# Patient Record
Sex: Male | Born: 1940 | Race: White | Hispanic: No | Marital: Single | Smoking: Former smoker
Health system: Southern US, Community
[De-identification: ages and names within clinical notes are randomized; demographics above are authoritative.]

## PROBLEM LIST (undated history)

## (undated) DIAGNOSIS — I77811 Abdominal aortic ectasia: Secondary | ICD-10-CM

## (undated) DIAGNOSIS — D49511 Neoplasm of unspecified behavior of right kidney: Secondary | ICD-10-CM

## (undated) DIAGNOSIS — I10 Essential (primary) hypertension: Secondary | ICD-10-CM

## (undated) DIAGNOSIS — N400 Enlarged prostate without lower urinary tract symptoms: Secondary | ICD-10-CM

## (undated) DIAGNOSIS — I509 Heart failure, unspecified: Secondary | ICD-10-CM

## (undated) DIAGNOSIS — D649 Anemia, unspecified: Secondary | ICD-10-CM

## (undated) DIAGNOSIS — K8020 Calculus of gallbladder without cholecystitis without obstruction: Secondary | ICD-10-CM

## (undated) DIAGNOSIS — D696 Thrombocytopenia, unspecified: Secondary | ICD-10-CM

## (undated) DIAGNOSIS — K5792 Diverticulitis of intestine, part unspecified, without perforation or abscess without bleeding: Secondary | ICD-10-CM

## (undated) DIAGNOSIS — J189 Pneumonia, unspecified organism: Secondary | ICD-10-CM

## (undated) DIAGNOSIS — C801 Malignant (primary) neoplasm, unspecified: Secondary | ICD-10-CM

## (undated) DIAGNOSIS — R41841 Cognitive communication deficit: Secondary | ICD-10-CM

## (undated) DIAGNOSIS — I499 Cardiac arrhythmia, unspecified: Secondary | ICD-10-CM

## (undated) DIAGNOSIS — I4891 Unspecified atrial fibrillation: Secondary | ICD-10-CM

## (undated) DIAGNOSIS — I700 Atherosclerosis of aorta: Secondary | ICD-10-CM

## (undated) DIAGNOSIS — N189 Chronic kidney disease, unspecified: Secondary | ICD-10-CM

## (undated) DIAGNOSIS — E559 Vitamin D deficiency, unspecified: Secondary | ICD-10-CM

## (undated) DIAGNOSIS — I714 Abdominal aortic aneurysm, without rupture, unspecified: Secondary | ICD-10-CM

## (undated) DIAGNOSIS — E669 Obesity, unspecified: Secondary | ICD-10-CM

## (undated) HISTORY — DX: Abdominal aortic ectasia: I77.811

## (undated) HISTORY — DX: Abdominal aortic aneurysm, without rupture, unspecified: I71.40

## (undated) HISTORY — PX: CATARACT EXTRACTION: SUR2

## (undated) HISTORY — DX: Obesity, unspecified: E66.9

## (undated) HISTORY — DX: Thrombocytopenia, unspecified: D69.6

## (undated) HISTORY — DX: Essential (primary) hypertension: I10

## (undated) HISTORY — DX: Anemia, unspecified: D64.9

## (undated) HISTORY — DX: Benign prostatic hyperplasia without lower urinary tract symptoms: N40.0

## (undated) HISTORY — DX: Vitamin D deficiency, unspecified: E55.9

## (undated) HISTORY — DX: Heart failure, unspecified: I50.9

## (undated) HISTORY — DX: Chronic kidney disease, unspecified: N18.9

## (undated) HISTORY — DX: Calculus of gallbladder without cholecystitis without obstruction: K80.20

## (undated) HISTORY — DX: Diverticulitis of intestine, part unspecified, without perforation or abscess without bleeding: K57.92

## (undated) HISTORY — DX: Cognitive communication deficit: R41.841

## (undated) HISTORY — DX: Atherosclerosis of aorta: I70.0

## (undated) HISTORY — DX: Neoplasm of unspecified behavior of right kidney: D49.511

## (undated) HISTORY — DX: Unspecified atrial fibrillation: I48.91

## (undated) HISTORY — DX: Abdominal aortic aneurysm, without rupture: I71.4

---

## 2006-10-28 ENCOUNTER — Ambulatory Visit: Payer: Self-pay | Admitting: Cardiology

## 2006-11-15 ENCOUNTER — Ambulatory Visit: Payer: Self-pay | Admitting: Cardiology

## 2006-11-22 ENCOUNTER — Ambulatory Visit: Payer: Self-pay | Admitting: Cardiology

## 2006-11-29 ENCOUNTER — Ambulatory Visit: Payer: Self-pay | Admitting: Cardiology

## 2007-01-16 ENCOUNTER — Ambulatory Visit: Payer: Self-pay | Admitting: Cardiology

## 2015-09-19 ENCOUNTER — Telehealth: Payer: Self-pay | Admitting: Internal Medicine

## 2017-01-18 ENCOUNTER — Ambulatory Visit (INDEPENDENT_AMBULATORY_CARE_PROVIDER_SITE_OTHER): Payer: Medicare Other | Admitting: Urology

## 2017-01-18 DIAGNOSIS — R310 Gross hematuria: Secondary | ICD-10-CM

## 2017-01-18 DIAGNOSIS — C641 Malignant neoplasm of right kidney, except renal pelvis: Secondary | ICD-10-CM

## 2017-02-01 ENCOUNTER — Ambulatory Visit (INDEPENDENT_AMBULATORY_CARE_PROVIDER_SITE_OTHER): Payer: Medicare Other | Admitting: Urology

## 2017-02-01 DIAGNOSIS — C641 Malignant neoplasm of right kidney, except renal pelvis: Secondary | ICD-10-CM

## 2017-03-03 ENCOUNTER — Other Ambulatory Visit (HOSPITAL_COMMUNITY): Payer: Self-pay | Admitting: Internal Medicine

## 2017-03-03 ENCOUNTER — Ambulatory Visit (HOSPITAL_COMMUNITY): Payer: Medicare Other

## 2017-03-03 DIAGNOSIS — R443 Hallucinations, unspecified: Secondary | ICD-10-CM

## 2017-03-03 DIAGNOSIS — R410 Disorientation, unspecified: Secondary | ICD-10-CM

## 2017-03-16 ENCOUNTER — Ambulatory Visit (HOSPITAL_COMMUNITY)
Admission: RE | Admit: 2017-03-16 | Discharge: 2017-03-16 | Disposition: A | Discharge status: Home / Self Care | Payer: Medicare Other | Source: Ambulatory Visit | Attending: Internal Medicine | Admitting: Internal Medicine

## 2017-03-16 DIAGNOSIS — R443 Hallucinations, unspecified: Secondary | ICD-10-CM | POA: Diagnosis not present

## 2017-03-16 DIAGNOSIS — G319 Degenerative disease of nervous system, unspecified: Secondary | ICD-10-CM | POA: Diagnosis not present

## 2017-03-16 DIAGNOSIS — R410 Disorientation, unspecified: Secondary | ICD-10-CM

## 2017-05-04 ENCOUNTER — Encounter (HOSPITAL_COMMUNITY): Payer: Self-pay

## 2017-05-04 ENCOUNTER — Encounter (HOSPITAL_COMMUNITY): Payer: Medicare Other

## 2017-05-04 ENCOUNTER — Encounter (HOSPITAL_COMMUNITY): Payer: Medicare Other | Attending: Oncology | Admitting: Oncology

## 2017-05-04 DIAGNOSIS — N2889 Other specified disorders of kidney and ureter: Secondary | ICD-10-CM | POA: Insufficient documentation

## 2017-05-04 LAB — LACTATE DEHYDROGENASE: LDH: 134 U/L (ref 98–192)

## 2017-05-04 LAB — CBC WITH DIFFERENTIAL/PLATELET
Basophils Absolute: 0 10*3/uL (ref 0.0–0.1)
Basophils Relative: 1 %
EOS ABS: 0.3 10*3/uL (ref 0.0–0.7)
EOS PCT: 5 %
HCT: 36.6 % — ABNORMAL LOW (ref 39.0–52.0)
Hemoglobin: 12 g/dL — ABNORMAL LOW (ref 13.0–17.0)
LYMPHS ABS: 1 10*3/uL (ref 0.7–4.0)
Lymphocytes Relative: 18 %
MCH: 31.3 pg (ref 26.0–34.0)
MCHC: 32.8 g/dL (ref 30.0–36.0)
MCV: 95.3 fL (ref 78.0–100.0)
MONO ABS: 0.5 10*3/uL (ref 0.1–1.0)
Monocytes Relative: 8 %
Neutro Abs: 3.9 10*3/uL (ref 1.7–7.7)
Neutrophils Relative %: 68 %
PLATELETS: 190 10*3/uL (ref 150–400)
RBC: 3.84 MIL/uL — AB (ref 4.22–5.81)
RDW: 15 % (ref 11.5–15.5)
WBC: 5.7 10*3/uL (ref 4.0–10.5)

## 2017-05-04 LAB — COMPREHENSIVE METABOLIC PANEL
ALT: 13 U/L — AB (ref 17–63)
ANION GAP: 6 (ref 5–15)
AST: 14 U/L — ABNORMAL LOW (ref 15–41)
Albumin: 3.1 g/dL — ABNORMAL LOW (ref 3.5–5.0)
Alkaline Phosphatase: 68 U/L (ref 38–126)
BUN: 22 mg/dL — ABNORMAL HIGH (ref 6–20)
CHLORIDE: 106 mmol/L (ref 101–111)
CO2: 26 mmol/L (ref 22–32)
CREATININE: 1.28 mg/dL — AB (ref 0.61–1.24)
Calcium: 8.8 mg/dL — ABNORMAL LOW (ref 8.9–10.3)
GFR calc non Af Amer: 53 mL/min — ABNORMAL LOW (ref >60)
Glucose, Bld: 110 mg/dL — ABNORMAL HIGH (ref 65–99)
POTASSIUM: 4.4 mmol/L (ref 3.5–5.1)
SODIUM: 138 mmol/L (ref 135–145)
Total Bilirubin: 0.5 mg/dL (ref 0.3–1.2)
Total Protein: 6.7 g/dL (ref 6.5–8.1)

## 2017-05-04 NOTE — Progress Notes (Signed)
[facility] Initial Visit:  Patient Care Team: Hilbert Corrigan, MD as PCP - General (Internal Medicine)  CHIEF COMPLAINTS/PURPOSE OF CONSULTATION:  Right renal mass  HISTORY OF PRESENTING ILLNESS: Vincent Pope 76 y.o. male is here because of  right renal mass suspicious for renal cell carcinoma. He is currently staying at [facility] and rehabilitation.  His most recent scans demonstrate:  CT abdomen and pelvis with contrast 01/09/17 demonstrated a 8.6 x 6.4 cm complex solid/cystic mass within the superior pole of the right kidney, previously 6.7 x 5.2 cm. Smaller bilateral low attenuation renal lesions are stable when compared to prior exam. Cysts within the left hepatic lobe is increased in size measuring up to 7.4 cm, previously 5.5 cm.  Chest x-ray 01/09/17 demonstrated a low lung volumes with basilar opacities primary and to represent atelectasis.  MRI abdomen with and without contrast 01/10/17 which demonstrated a heterogeneously enhancing mass in the right kidney, 6.2 x 8.7 x 6 cm, the lesion extends up to the renal vein but does not appear to extend into the renal vein at this time. This lesion remains in capsulated with [name], no lymphadenopathy. He also has multiple small renal cysts as well as numerous liver cysts as well.  Patient had gone for surgical evaluation by [name] back on 02/01/17 and was deemed not to be a surgical candidate due to his weakened state. Patient had just arrived at the skilled nursing facility for rehabilitation in February 2018. At that time he could not stand up or get out of his wheelchair and spent most of his time in bed. Per [name] know at that time, patient had abdominal CT dating back to April 2011 which revealed a 5.4 cm indeterminate cystic focus. Follow-up CT scan in October 2015 revealed a complex cystic mass measuring 6.5 x 5.2 x 4.5 cm in the right upper pole. 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  Currently patient states that his performance status is a lot better. With physical therapy he is now ambulatory. He denies any recent hematuria. He denies any chest pain, shortness breath, abdominal pain, focal weakness. He denies any weight loss, appetite is good.  Review of Systems  Constitutional: Negative for appetite change, chills, fatigue and fever.  HENT:   Negative for hearing loss, lump/mass, mouth sores, sore throat and tinnitus.   Eyes: Negative for eye problems and icterus.  Respiratory: Negative for chest tightness, cough, hemoptysis, shortness of breath and wheezing.   Cardiovascular: Negative for chest pain, leg swelling and palpitations.  Gastrointestinal: Negative for abdominal distention, abdominal pain, blood in stool, diarrhea, nausea and vomiting.  Endocrine: Negative.  Negative for hot flashes.  Genitourinary: Negative for difficulty urinating, frequency and hematuria.   Musculoskeletal: Negative for arthralgias and neck pain.  Skin: Negative for itching and rash.  Neurological: Negative for dizziness, headaches and speech difficulty.  Hematological: Negative for adenopathy. Does not bruise/bleed easily.  Psychiatric/Behavioral: Negative for confusion. The patient is not nervous/anxious.     MEDICAL HISTORY: Past Medical History:  Diagnosis Date  . 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 Atherosclerosis of aorta ([place])   . Atrial fibrillation ([place])   . Benign prostatic hyperplasia   . Cognitive communication deficit   . Congestive heart failure (CHF) ([place])   . Diverticulitis   . Hypertension, essential   . Neoplasm of right kidney   . Thrombocytopenia ([place])   . Vitamin D deficiency  SURGICAL HISTORY: Past Surgical History:  Procedure Laterality Date  . CATARACT EXTRACTION Bilateral     SOCIAL HISTORY: Social History   Social History  . Marital status: Married    Spouse name: N/A  . Number of children: N/A  .  Years of education: N/A   Occupational History  . Not on file.   Social History Main Topics  . Smoking status: Former Research scientist (life sciences)  . Smokeless tobacco: Never Used  . Alcohol use No  . Drug use: No  . Sexual activity: Not Currently   Other Topics Concern  . Not on file   Social History Narrative  . No narrative on file    FAMILY HISTORY History reviewed. No pertinent family history.  ALLERGIES:  has no allergies on file.  MEDICATIONS:  Current Outpatient Prescriptions  Medication Sig Dispense Refill  . Amino Acids-Protein Hydrolys (FEEDING SUPPLEMENT, PRO-STAT SUGAR FREE 64,) LIQD Take 30 mLs by mouth 3 (three) times daily with meals.    Marland Kitchen aspirin 81 MG chewable tablet Chew 81 mg by mouth daily.    . Cholecalciferol (VITAMIN D3) 1000 units CAPS Take 1 capsule by mouth daily.    . furosemide (LASIX) 40 MG tablet Take 40 mg by mouth daily.    Marland Kitchen lisinopril (PRINIVIL,ZESTRIL) 20 MG tablet Take 20 mg by mouth daily.    . metoprolol tartrate (LOPRESSOR) 25 MG tablet Take 25 mg by mouth 2 (two) times daily.    . multivitamin with minerals (CERTA-VITE) LIQD Take 5 mLs by mouth daily.    . pantoprazole (PROTONIX) 40 MG tablet Take 40 mg by mouth daily.    . polyethylene glycol (MIRALAX / GLYCOLAX) packet Take 17 g by mouth daily.    . tamsulosin (FLOMAX) 0.4 MG CAPS capsule Take 0.4 mg by mouth daily.    . traMADol (ULTRAM) 50 MG tablet Take 50 mg by mouth every 6 (six) hours as needed.     No current facility-administered medications for this visit.     PHYSICAL EXAMINATION:  ECOG PERFORMANCE STATUS: 1 - Symptomatic but completely ambulatory   Vitals:   05/04/17 1255  BP: (!) 121/58  Pulse: 79  Resp: 20  Temp: 97.9 F (36.6 C)    Filed Weights   05/04/17 1255  Weight: 277 lb 3.2 oz (125.7 kg)     Physical Exam  Constitutional: He is oriented to person, place, and time and well-developed, well-nourished, and in no distress. No distress.  HENT:  Head: Normocephalic  and atraumatic.  Mouth/Throat: No oropharyngeal exudate.  Eyes: Conjunctivae are normal. Pupils are equal, round, and reactive to light. No scleral icterus.  Neck: Normal range of motion. Neck supple. No JVD present.  Cardiovascular: Normal rate and normal heart sounds.  Exam reveals no gallop and no friction rub.   No murmur heard. Irregularly irregular rhythm  Pulmonary/Chest: Breath sounds normal. No respiratory distress. He has no wheezes. He has no rales.  Abdominal: Soft. Bowel sounds are normal. He exhibits no distension. There is no tenderness. There is no guarding.  Musculoskeletal: He exhibits edema. He exhibits no tenderness.  Lipoma on right side of mid back  Lymphadenopathy:    He has no cervical adenopathy.  Neurological: He is alert and oriented to person, place, and time. No cranial nerve deficit.  Skin: Skin is warm and dry. No rash noted. No erythema. No pallor.  Psychiatric: Affect and judgment normal.     LABORATORY DATA: I have personally reviewed the data as listed:  No  visits with results within 1 Month(s) from this visit.  Latest known visit with results is:  No results found for any previous visit.    RADIOGRAPHIC STUDIES: I have personally reviewed the radiological images as listed and agree with the findings in the report  No results found.  ASSESSMENT/PLAN 76 year old man with complex cystic right renal mass in the upper pole, suspicious for renal cell carcinoma. Based on his last MRI scans he is a clinical stage II (T2a N0 Mx)  PLAN: -I will plan to get stat restaging CT chest/abdomen/pelvis with contrast with patient since it is about 4 months since his last scans. If he continues to have disease limited to the right kidney, I will send him back for reevaluation by Dr.Dahlstedt since his performance status has improved tremendously with physical therapy. I discussed with the patient that his best chance for cure is for surgical resection of the  mass. -I will also check a CBC, CMP, LDH in order to calculate his [facility] prognostic score. -If patient should have metastatic disease, then I will plan to proceed with biopsy of the renal mass to determine whether it is clear cell or non-clear cell histology, which I will need to know in order to plan which treatment he will receive.  -RTC in 10 days for follow up and to review scans and to discuss the next plan of care.    Orders Placed This Encounter  Procedures  . CT Abdomen Pelvis W Contrast    Standing Status:   Future    Standing Expiration Date:   05/04/2018    Order Specific Question:   If indicated for the ordered procedure, I authorize the administration of contrast media per Radiology protocol    Answer:   Yes    Order Specific Question:   Reason for Exam (SYMPTOM  OR DIAGNOSIS REQUIRED)    Answer:   staging scans for RCC    Order Specific Question:   Preferred imaging location?    Answer:   [facility]    Order Specific Question:   Radiology Contrast Protocol - do NOT remove file path    Answer:   \\[email or web address].pdf  . CT Chest W Contrast    Standing Status:   Future    Standing Expiration Date:   05/04/2018    Order Specific Question:   If indicated for the ordered procedure, I authorize the administration of contrast media per Radiology protocol    Answer:   Yes    Order Specific Question:   Reason for Exam (SYMPTOM  OR DIAGNOSIS REQUIRED)    Answer:   staging scans for RCC    Order Specific Question:   Preferred imaging location?    Answer:   [facility]    Order Specific Question:   Radiology Contrast Protocol - do NOT remove file path    Answer:   \\[email or web address].pdf  . CBC with Differential    Standing Status:   Future    Standing Expiration Date:   05/04/2018  . Comprehensive metabolic panel    Standing Status:   Future    Standing Expiration Date:   05/04/2018  . Lactate dehydrogenase    Standing  Status:   Future    Standing Expiration Date:   05/04/2018    All questions were answered. The patient knows to call the clinic with any problems, questions or concerns.  This note was electronically signed.    Twana First, MD  05/04/2017 1:15 PM

## 2017-05-04 NOTE — Patient Instructions (Signed)
[facility] at [facility] Discharge Instructions  RECOMMENDATIONS MADE BY THE CONSULTANT AND ANY TEST RESULTS WILL BE SENT TO YOUR REFERRING PHYSICIAN.  You were seen today by Dr. Louise Zhou    Thank you for choosing [facility] at [facility] to provide your oncology and hematology care.  To afford each patient quality time with our provider, please arrive at least 15 minutes before your scheduled appointment time.    If you have a lab appointment with the Cancer Center please come in thru the  Main Entrance and check in at the main information desk  You need to re-schedule your appointment should you arrive 10 or more minutes late.  We strive to give you quality time with our providers, and arriving late affects you and other patients whose appointments are after yours.  Also, if you no show three or more times for appointments you may be dismissed from the clinic at the providers discretion.     Again, thank you for choosing [facility].  Our hope is that these requests will decrease the amount of time that you wait before being seen by our physicians.       _____________________________________________________________  Should you have questions after your visit to [facility], please contact our office at [phone] between the hours of 8:30 a.m. and 4:30 p.m.  Voicemails left after 4:30 p.m. will not be returned until the following business day.  For prescription refill requests, have your pharmacy contact our office.       Resources For Cancer Patients and their Caregivers ? American Cancer Society: Can assist with transportation, wigs, general needs, runs Look Good Feel Better.        1-888-227-6333 ? Cancer Care: Provides financial assistance, online support groups, medication/co-pay assistance.  1-800-813-HOPE (4673) ? [facility] Assists [place] cancer patients and their  families through emotional , educational and financial support.  336-427-4357 ? [place] DSS Where to apply for food stamps, Medicaid and utility assistance. 336-342-1394 ? [facility]: Transportation to medical appointments. 336-347-2287 ? Social Security Administration: May apply for disability if have a Stage IV cancer. 336-342-7796 1-800-772-1213 ? [place] Aging, Disability and Transit Services: Assists with nutrition, care and transit needs. 336-349-2343  Cancer Center Support Programs: @10RELATIVEDAYS@ > Cancer Support Group  2nd Tuesday of the month 1pm-2pm, Journey Room  > Creative Journey  3rd Tuesday of the month 1130am-1pm, Journey Room  > Look Good Feel Better  1st Wednesday of the month 10am-12 noon, Journey Room (Call American Cancer Society to register 1-800-395-5775)    

## 2017-05-05 ENCOUNTER — Encounter (HOSPITAL_COMMUNITY): Payer: Self-pay

## 2017-05-05 ENCOUNTER — Ambulatory Visit (HOSPITAL_COMMUNITY)
Admission: RE | Admit: 2017-05-05 | Discharge: 2017-05-05 | Disposition: A | Discharge status: Home / Self Care | Payer: Medicare Other | Source: Ambulatory Visit | Attending: Oncology | Admitting: Oncology

## 2017-05-05 DIAGNOSIS — I714 Abdominal aortic aneurysm, without rupture: Secondary | ICD-10-CM | POA: Insufficient documentation

## 2017-05-05 DIAGNOSIS — I700 Atherosclerosis of aorta: Secondary | ICD-10-CM | POA: Diagnosis not present

## 2017-05-05 DIAGNOSIS — E041 Nontoxic single thyroid nodule: Secondary | ICD-10-CM | POA: Diagnosis not present

## 2017-05-05 DIAGNOSIS — K5730 Diverticulosis of large intestine without perforation or abscess without bleeding: Secondary | ICD-10-CM | POA: Insufficient documentation

## 2017-05-05 DIAGNOSIS — N2889 Other specified disorders of kidney and ureter: Secondary | ICD-10-CM | POA: Insufficient documentation

## 2017-05-05 DIAGNOSIS — K8020 Calculus of gallbladder without cholecystitis without obstruction: Secondary | ICD-10-CM | POA: Insufficient documentation

## 2017-05-05 HISTORY — DX: Malignant (primary) neoplasm, unspecified: C80.1

## 2017-05-05 MED ORDER — IOPAMIDOL (ISOVUE-300) INJECTION 61%
100.0000 mL | Freq: Once | INTRAVENOUS | Status: AC | PRN
  Administered 2017-05-05: 100 mL via INTRAVENOUS

## 2017-05-13 ENCOUNTER — Encounter (HOSPITAL_COMMUNITY): Payer: Self-pay | Admitting: Lab

## 2017-05-13 ENCOUNTER — Encounter (HOSPITAL_COMMUNITY): Payer: Self-pay

## 2017-05-13 ENCOUNTER — Encounter (HOSPITAL_COMMUNITY): Payer: Medicare Other | Attending: Oncology | Admitting: Oncology

## 2017-05-13 VITALS — BP 98/53 | HR 59 | Temp 97.8°F | Resp 20 | Wt 284.7 lb

## 2017-05-13 DIAGNOSIS — N2889 Other specified disorders of kidney and ureter: Secondary | ICD-10-CM

## 2017-05-13 DIAGNOSIS — I714 Abdominal aortic aneurysm, without rupture: Secondary | ICD-10-CM

## 2017-05-13 DIAGNOSIS — E041 Nontoxic single thyroid nodule: Secondary | ICD-10-CM | POA: Diagnosis not present

## 2017-05-13 NOTE — Patient Instructions (Addendum)
[facility] at [facility] Discharge Instructions  RECOMMENDATIONS MADE BY THE CONSULTANT AND ANY TEST RESULTS WILL BE SENT TO YOUR REFERRING PHYSICIAN.  You were seen today by Dr. Twana First Follow up in 4 weeks or sooner depending on what [name] recommends for treatment.   Thank you for choosing [place] at [facility] to provide your oncology and hematology care.  To afford each patient quality time with our provider, please arrive at least 15 minutes before your scheduled appointment time.    If you have a lab appointment with the [facility] please come in thru the  Main Entrance and check in at the main information desk  You need to re-schedule your appointment should you arrive 10 or more minutes late.  We strive to give you quality time with our providers, and arriving late affects you and other patients whose appointments are after yours.  Also, if you no show three or more times for appointments you may be dismissed from the clinic at the providers discretion.     Again, thank you for choosing [facility].  Our hope is that these requests will decrease the amount of time that you wait before being seen by our physicians.       _____________________________________________________________  Should you have questions after your visit to [facility], please contact our office at [phone] between the hours of 8:30 a.m. and 4:30 p.m.  Voicemails left after 4:30 p.m. will not be returned until the following business day.  For prescription refill requests, have your pharmacy contact our office.       Resources For Cancer Patients and their Caregivers ? American Cancer Society: Can assist with transportation, wigs, general needs, runs Look Good Feel Better.        757-302-4813 ? Cancer Care: Provides financial assistance, online support groups, medication/co-pay assistance.  1-800-813-HOPE  (430)609-8600) ? [facility] Assists [place] cancer patients and their families through emotional , educational and financial support.  302-305-4931 ? [place] DSS Where to apply for food stamps, Medicaid and utility assistance. 586 016 3833 ? RCATS: Transportation to medical appointments. 478-259-1033 ? Social Security Administration: May apply for disability if have a Stage IV cancer. 947-341-9643 252-686-8393 ? [employer], Disability and Transit Services: Assists with nutrition, care and transit needs. [place] Support Programs: @10RELATIVEDAYS @ > Cancer Support Group  2nd Tuesday of the month 1pm-2pm, Journey Room  > Creative Journey  3rd Tuesday of the month 1130am-1pm, Journey Room  > Look Good Feel Better  1st Wednesday of the month 10am-12 noon, Journey Room (Call [place] to register [phone])

## 2017-05-13 NOTE — Progress Notes (Unsigned)
Referral sent to [facility] (Refer back to [name] for evaluation of resection of right kidney mass).  Records faxed on 6/1.  Office to review and will determine about appt,  They will call [facility].

## 2017-05-13 NOTE — Progress Notes (Signed)
[place] [facility] Initial Visit:  Patient Care Team: Hilbert Corrigan, MD as PCP - General (Internal Medicine)  CHIEF COMPLAINTS/PURPOSE OF CONSULTATION:  Right renal mass  HISTORY OF PRESENTING ILLNESS: Vincent Pope 76 y.o. male is here because of  right renal mass suspicious for renal cell carcinoma. He is currently staying at [facility] and rehabilitation.  His most recent scans demonstrate:  CT abdomen and pelvis with contrast 01/09/17 demonstrated a 8.6 x 6.4 cm complex solid/cystic mass within the superior pole of the right kidney, previously 6.7 x 5.2 cm. Smaller bilateral low attenuation renal lesions are stable when compared to prior exam. Cysts within the left hepatic lobe is increased in size measuring up to 7.4 cm, previously 5.5 cm.  Chest x-ray 01/09/17 demonstrated a low lung volumes with basilar opacities primary and to represent atelectasis.  MRI abdomen with and without contrast 01/10/17 which demonstrated a heterogeneously enhancing mass in the right kidney, 6.2 x 8.7 x 6 cm, the lesion extends up to the renal vein but does not appear to extend into the renal vein at this time. This lesion remains in capsulated with [name], no lymphadenopathy. He also has multiple small renal cysts as well as numerous liver cysts as well.  Patient had gone for surgical evaluation by [name] back on 02/01/17 and was deemed not to be a surgical candidate due to his weakened state. Patient had just arrived at the skilled nursing facility for rehabilitation in February 2018. At that time he could not stand up or get out of his wheelchair and spent most of his time in bed. Per [name] know at that time, patient had abdominal CT dating back to April 2011 which revealed a 5.4 cm indeterminate cystic focus. Follow-up CT scan in October 2015 revealed a complex cystic mass measuring 6.5 x 5.2 x 4.5 cm in the right upper pole. 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  Currently patient states that his performance status is a lot better. With physical therapy he is now ambulatory. He denies any recent hematuria. He denies any chest pain, shortness breath, abdominal pain, focal weakness. He denies any weight loss, appetite is good.  INTERVAL HISTORY: Patient presents today for review of his CT scan results. He has no new complaints since his last visit.  CT C/A/P 05/05/17 demonstrated stable upper pole right renal mass with imaging features suspicious for renal cell carcinoma, measuring 8.8 x 6.3 cm in the axial plane today. No evidence of metastatic disease in the chest, abdomen or pelvis. LDH was WNL.    Review of Systems  Constitutional: Negative for appetite change, chills, fatigue and fever.  HENT:   Negative for hearing loss, lump/mass, mouth sores, sore throat and tinnitus.   Eyes: Negative for eye problems and icterus.  Respiratory: Negative for chest tightness, cough, hemoptysis, shortness of breath and wheezing.   Cardiovascular: Negative for chest pain, leg swelling and palpitations.  Gastrointestinal: Negative for abdominal distention, abdominal pain, blood in stool, diarrhea, nausea and vomiting.  Endocrine: Negative.  Negative for hot flashes.  Genitourinary: Negative for difficulty urinating, frequency and hematuria.   Musculoskeletal: Negative for arthralgias and neck pain.  Skin: Negative for itching and rash.  Neurological: Negative for dizziness, headaches and speech difficulty.  Hematological: Negative for adenopathy. Does not bruise/bleed easily.  Psychiatric/Behavioral: Negative for confusion. The patient is not nervous/anxious.     MEDICAL HISTORY: Past Medical History:  Diagnosis Date  .  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 Atherosclerosis of aorta ([place])   . Atrial fibrillation ([place])   . Benign prostatic hyperplasia   . Cancer ([place])   . Cognitive communication deficit   . Congestive  heart failure (CHF) ([place])   . Diverticulitis   . Hypertension, essential   . Neoplasm of right kidney   . Thrombocytopenia ([place])   . Vitamin D deficiency     SURGICAL HISTORY: Past Surgical History:  Procedure Laterality Date  . CATARACT EXTRACTION Bilateral     SOCIAL HISTORY: Social History   Social History  . Marital status: Married    Spouse name: N/A  . Number of children: N/A  . Years of education: N/A   Occupational History  . Not on file.   Social History Main Topics  . Smoking status: Former Research scientist (life sciences)  . Smokeless tobacco: Never Used  . Alcohol use No  . Drug use: No  . Sexual activity: Not Currently   Other Topics Concern  . Not on file   Social History Narrative  . No narrative on file    FAMILY HISTORY No family history on file.  ALLERGIES:  has no allergies on file.  MEDICATIONS:  Current Outpatient Prescriptions  Medication Sig Dispense Refill  . Amino Acids-Protein Hydrolys (FEEDING SUPPLEMENT, PRO-STAT SUGAR FREE 64,) LIQD Take 30 mLs by mouth 3 (three) times daily with meals.    Marland Kitchen aspirin 81 MG chewable tablet Chew 81 mg by mouth daily.    . Cholecalciferol (VITAMIN D3) 1000 units CAPS Take 1 capsule by mouth daily.    . furosemide (LASIX) 40 MG tablet Take 40 mg by mouth daily.    Marland Kitchen lisinopril (PRINIVIL,ZESTRIL) 20 MG tablet Take 20 mg by mouth daily.    . metoprolol tartrate (LOPRESSOR) 25 MG tablet Take 25 mg by mouth 2 (two) times daily.    . multivitamin with minerals (CERTA-VITE) LIQD Take 5 mLs by mouth daily.    . pantoprazole (PROTONIX) 40 MG tablet Take 40 mg by mouth daily.    . polyethylene glycol (MIRALAX / GLYCOLAX) packet Take 17 g by mouth daily.    . tamsulosin (FLOMAX) 0.4 MG CAPS capsule Take 0.4 mg by mouth daily.    . traMADol (ULTRAM) 50 MG tablet Take 50 mg by mouth every 6 (six) hours as needed.     No current facility-administered medications for this visit.     PHYSICAL EXAMINATION:  ECOG PERFORMANCE STATUS: 1 -  Symptomatic but completely ambulatory   Vitals:   05/13/17 1539  BP: (!) 98/53  Pulse: (!) 59  Resp: 20  Temp: 97.8 F (36.6 C)     Physical Exam  Constitutional: He is oriented to person, place, and time and well-developed, well-nourished, and in no distress. No distress.  HENT:  Head: Normocephalic and atraumatic.  Mouth/Throat: No oropharyngeal exudate.  Eyes: Conjunctivae are normal. Pupils are equal, round, and reactive to light. No scleral icterus.  Neck: Normal range of motion. Neck supple. No JVD present.  Cardiovascular: Normal rate and normal heart sounds.  Exam reveals no gallop and no friction rub.   No murmur heard. Irregularly irregular rhythm  Pulmonary/Chest: Breath sounds normal. No respiratory distress. He has no wheezes. He has no rales.  Abdominal: Soft. Bowel sounds are normal. He exhibits no distension. There is no tenderness. There is no guarding.  Musculoskeletal: He exhibits edema. He exhibits no tenderness.  Lipoma on right side of mid back  Lymphadenopathy:    He  has no cervical adenopathy.  Neurological: He is alert and oriented to person, place, and time. No cranial nerve deficit.  Skin: Skin is warm and dry. No rash noted. No erythema. No pallor.  Psychiatric: Affect and judgment normal.     LABORATORY DATA: I have personally reviewed the data as listed:  Appointment on 05/04/2017  Component Date Value Ref Range Status  . WBC 05/04/2017 5.7  4.0 - 10.5 K/uL Final  . RBC 05/04/2017 3.84* 4.22 - 5.81 MIL/uL Final  . Hemoglobin 05/04/2017 12.0* 13.0 - 17.0 g/dL Final  . HCT 05/04/2017 36.6* 39.0 - 52.0 % Final  . MCV 05/04/2017 95.3  78.0 - 100.0 fL Final  . MCH 05/04/2017 31.3  26.0 - 34.0 pg Final  . MCHC 05/04/2017 32.8  30.0 - 36.0 g/dL Final  . RDW 05/04/2017 15.0  11.5 - 15.5 % Final  . Platelets 05/04/2017 190  150 - 400 K/uL Final  . Neutrophils Relative % 05/04/2017 68  % Final  . Neutro Abs 05/04/2017 3.9  1.7 - 7.7 K/uL Final  .  Lymphocytes Relative 05/04/2017 18  % Final  . Lymphs Abs 05/04/2017 1.0  0.7 - 4.0 K/uL Final  . Monocytes Relative 05/04/2017 8  % Final  . Monocytes Absolute 05/04/2017 0.5  0.1 - 1.0 K/uL Final  . Eosinophils Relative 05/04/2017 5  % Final  . Eosinophils Absolute 05/04/2017 0.3  0.0 - 0.7 K/uL Final  . Basophils Relative 05/04/2017 1  % Final  . Basophils Absolute 05/04/2017 0.0  0.0 - 0.1 K/uL Final  . Sodium 05/04/2017 138  135 - 145 mmol/L Final  . Potassium 05/04/2017 4.4  3.5 - 5.1 mmol/L Final  . Chloride 05/04/2017 106  101 - 111 mmol/L Final  . CO2 05/04/2017 26  22 - 32 mmol/L Final  . Glucose, Bld 05/04/2017 110* 65 - 99 mg/dL Final  . BUN 05/04/2017 22* 6 - 20 mg/dL Final  . Creatinine, Ser 05/04/2017 1.28* 0.61 - 1.24 mg/dL Final  . Calcium 05/04/2017 8.8* 8.9 - 10.3 mg/dL Final  . Total Protein 05/04/2017 6.7  6.5 - 8.1 g/dL Final  . Albumin 05/04/2017 3.1* 3.5 - 5.0 g/dL Final  . AST 05/04/2017 14* 15 - 41 U/L Final  . ALT 05/04/2017 13* 17 - 63 U/L Final  . Alkaline Phosphatase 05/04/2017 68  38 - 126 U/L Final  . Total Bilirubin 05/04/2017 0.5  0.3 - 1.2 mg/dL Final  . GFR calc non Af Amer 05/04/2017 53* >60 mL/min Final  . GFR calc Af Amer 05/04/2017 >60  >60 mL/min Final   Comment: (NOTE) The eGFR has been calculated using the CKD EPI equation. This calculation has not been validated in all clinical situations. eGFR's persistently <60 mL/min signify possible Chronic Kidney Disease.   . Anion gap 05/04/2017 6  5 - 15 Final  . LDH 05/04/2017 134  98 - 192 U/L Final    RADIOGRAPHIC STUDIES: I have personally reviewed the radiological images as listed and agree with the findings in the report  CT C/A/P 5/24 /18: IMPRESSION: 1. 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 2. Stable upper pole right renal mass with imaging features suspicious for renal cell carcinoma, measuring 8.8 x 6.3 cm in the axial plane today. 3. No evidence of metastatic  disease in the chest, abdomen or pelvis. 4. Cholelithiasis without evidence cholecystitis. 5. 4.5 cm infrarenal abdominal aortic aneurysm with a mild increase in size. Recommend followup by abdomen and pelvis  CTA in 6 months, and vascular surgery referral/consultation if not already obtained. This recommendation follows ACR consensus guidelines: [name] on Vascular Findings. [name] 2013; 10:789-794. 6. Coronary artery and aortic atherosclerosis. 7. Sigmoid diverticulosis. 8. 4.3 cm right lobe thyroid nodule.   ASSESSMENT/PLAN 76 year old man with complex cystic right renal mass in the upper pole, suspicious for renal cell carcinoma. Based on his last MRI scans he is a clinical stage II (T2a N0 Mx), if this is malignancy.  Repeat CT C/A/P  05/05/17 demonstrated stable upper pole right renal mass with imaging features suspicious for renal cell carcinoma, measuring 8.8 x 6.3 cm in the axial plane today. No evidence of metastatic disease in the chest, abdomen or pelvis.  MSKCC Prognostic Model score: patient has 2 prognostic factors (karnofsky performance status <80% and hemoglobin less than lower limites of normal) which puts him in the intermediate risk group.   PLAN: -I have reviewed patient's restaging scans in detail with him. His renal mass is still limited to the kidney. His best chance of cure would be surgical resection at this time.  I will send him back for reevaluation by Dr.Dahlstedt since his performance status has improved tremendously with physical therapy. I discussed with the patient that his best chance for cure is for surgical resection of the mass.  -If urology still believes patient is not a good surgical candidate, then I will plan to proceed with biopsy of the renal mass by IR to determine whether it is clear cell or non-clear cell histology, which I will need to know in order to plan which treatment he will receive.    -I have discussed the other findings on his CT scan as well, including the thyroid nodule, abdominal aortic aneurysm, and changes of diskitis/osteomyelitis at L1-L2 with the patient. I have attached a copy of his CT scans to the note to be taken back to his PCP Dr.Ariza for him to view. I will defer to Dr. Mal Amabile regarding further workup for the thyroid nodule with a thyroid ultrasound. He will also need of repeat CT angiogram of the abdomen and 6 months for continued follow-up of the abdominal aortic aneurysm and referral to vascular surgery as needed to be determined by Dr. Mal Amabile. He will also need to see orthopedics for his discitis/osteomyelitis findings at L1-L2.  -Return to clinic in 4 weeks for follow-up. He is to come back sooner if he is to be deemed not a surgical candidate for his right renal mass.   All questions were answered. The patient knows to call the clinic with any problems, questions or concerns.  This note was electronically signed.    Twana First, MD  05/13/2017 3:35 PM

## 2017-06-07 ENCOUNTER — Ambulatory Visit (INDEPENDENT_AMBULATORY_CARE_PROVIDER_SITE_OTHER): Payer: Medicare Other | Admitting: Urology

## 2017-06-07 DIAGNOSIS — D49511 Neoplasm of unspecified behavior of right kidney: Secondary | ICD-10-CM

## 2017-06-20 ENCOUNTER — Encounter (HOSPITAL_COMMUNITY): Payer: Medicare Other | Attending: Oncology | Admitting: Oncology

## 2017-06-20 ENCOUNTER — Encounter (HOSPITAL_COMMUNITY): Payer: Self-pay

## 2017-06-20 VITALS — BP 109/38 | HR 92 | Resp 18

## 2017-06-20 DIAGNOSIS — N2889 Other specified disorders of kidney and ureter: Secondary | ICD-10-CM | POA: Insufficient documentation

## 2017-06-20 NOTE — Progress Notes (Signed)
[facility] Initial Visit:  Patient Care Team: Hilbert Corrigan, MD as PCP - General (Internal Medicine)  CHIEF COMPLAINTS/PURPOSE OF CONSULTATION:  Right renal mass  HISTORY OF PRESENTING ILLNESS: Vincent Pope 76 y.o. male is here because of  right renal mass suspicious for renal cell carcinoma. He is currently staying at [facility] and rehabilitation.  His most recent scans demonstrate:  CT abdomen and pelvis with contrast 01/09/17 demonstrated a 8.6 x 6.4 cm complex solid/cystic mass within the superior pole of the right kidney, previously 6.7 x 5.2 cm. Smaller bilateral low attenuation renal lesions are stable when compared to prior exam. Cysts within the left hepatic lobe is increased in size measuring up to 7.4 cm, previously 5.5 cm.  Chest x-ray 01/09/17 demonstrated a low lung volumes with basilar opacities primary and to represent atelectasis.  MRI abdomen with and without contrast 01/10/17 which demonstrated a heterogeneously enhancing mass in the right kidney, 6.2 x 8.7 x 6 cm, the lesion extends up to the renal vein but does not appear to extend into the renal vein at this time. This lesion remains in capsulated with [name], no lymphadenopathy. He also has multiple small renal cysts as well as numerous liver cysts as well.  Patient had gone for surgical evaluation by [name] back on 02/01/17 and was deemed not to be a surgical candidate due to his weakened state. Patient had just arrived at the skilled nursing facility for rehabilitation in February 2018. At that time he could not stand up or get out of his wheelchair and spent most of his time in bed. Per [name] know at that time, patient had abdominal CT dating back to April 2011 which revealed a 5.4 cm indeterminate cystic focus. Follow-up CT scan in October 2015 revealed a complex cystic mass measuring 6.5 x 5.2 x 4.5 cm in the right upper pole. 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  Currently patient states that his performance status is a lot better. With physical therapy he is now ambulatory. He denies any recent hematuria. He denies any chest pain, shortness breath, abdominal pain, focal weakness. He denies any weight loss, appetite is good.  CT C/A/P 05/05/17 demonstrated stable upper pole right renal mass with imaging features suspicious for renal cell carcinoma, measuring 8.8 x 6.3 cm in the axial plane today. No evidence of metastatic disease in the chest, abdomen or pelvis. LDH was WNL.  INTERVAL HISTORY: Patient presents today for follow up. He has seen [name], his urologist, for reconsideration for surgical resection. He states that his urologist once him to go get cardiac clearance prior to performing laparoscopic surgery. Today he states that he is doing well and has no complaints. He denies any recent hematuria, weight loss, chest pain, shortness breath, abdominal pain, focal weakness.     Review of Systems  Constitutional: Negative for appetite change, chills, fatigue and fever.  HENT:   Negative for hearing loss, lump/mass, mouth sores, sore throat and tinnitus.   Eyes: Negative for eye problems and icterus.  Respiratory: Negative for chest tightness, cough, hemoptysis, shortness of breath and wheezing.   Cardiovascular: Negative for chest pain, leg swelling and palpitations.  Gastrointestinal: Negative for abdominal distention, abdominal pain, blood in stool, diarrhea, nausea and vomiting.  Endocrine: Negative.  Negative for hot flashes.  Genitourinary: Negative for difficulty urinating, frequency and hematuria.   Musculoskeletal: Negative for arthralgias and neck pain.  Skin: Negative for itching  and rash.  Neurological: Negative for dizziness, headaches and speech difficulty.  Hematological: Negative for adenopathy. Does not bruise/bleed easily.  Psychiatric/Behavioral: Negative for confusion. The patient  is not nervous/anxious.     MEDICAL HISTORY: Past Medical History:  Diagnosis Date  . 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 Atherosclerosis of aorta ([place])   . Atrial fibrillation ([place])   . Benign prostatic hyperplasia   . Cancer ([place])   . Cognitive communication deficit   . Congestive heart failure (CHF) ([place])   . Diverticulitis   . Hypertension, essential   . Neoplasm of right kidney   . Thrombocytopenia ([place])   . Vitamin D deficiency     SURGICAL HISTORY: Past Surgical History:  Procedure Laterality Date  . CATARACT EXTRACTION Bilateral     SOCIAL HISTORY: Social History   Social History  . Marital status: Married    Spouse name: N/A  . Number of children: N/A  . Years of education: N/A   Occupational History  . Not on file.   Social History Main Topics  . Smoking status: Former Research scientist (life sciences)  . Smokeless tobacco: Never Used  . Alcohol use No  . Drug use: No  . Sexual activity: Not Currently   Other Topics Concern  . Not on file   Social History Narrative  . No narrative on file    FAMILY HISTORY History reviewed. No pertinent family history.  ALLERGIES:  has no allergies on file.  MEDICATIONS:  Current Outpatient Prescriptions  Medication Sig Dispense Refill  . Amino Acids-Protein Hydrolys (FEEDING SUPPLEMENT, PRO-STAT SUGAR FREE 64,) LIQD Take 30 mLs by mouth 3 (three) times daily with meals.    Marland Kitchen aspirin 81 MG chewable tablet Chew 81 mg by mouth daily.    . Cholecalciferol (VITAMIN D3) 1000 units CAPS Take 1 capsule by mouth daily.    . furosemide (LASIX) 40 MG tablet Take 40 mg by mouth daily.    Marland Kitchen lisinopril (PRINIVIL,ZESTRIL) 20 MG tablet Take 20 mg by mouth daily.    . metoprolol tartrate (LOPRESSOR) 25 MG tablet Take 25 mg by mouth 2 (two) times daily.    . pantoprazole (PROTONIX) 40 MG tablet Take 40 mg by mouth daily.    . polyethylene glycol (MIRALAX / GLYCOLAX) packet Take 17 g by mouth daily.    . tamsulosin (FLOMAX) 0.4 MG CAPS capsule Take  0.4 mg by mouth daily.    . traMADol (ULTRAM) 50 MG tablet Take 50 mg by mouth every 6 (six) hours as needed.     No current facility-administered medications for this visit.     PHYSICAL EXAMINATION:  ECOG PERFORMANCE STATUS: 1 - Symptomatic but completely ambulatory   Vitals:   06/20/17 1451  BP: (!) 109/38  Pulse: 92  Resp: 18     Physical Exam  Constitutional: He is oriented to person, place, and time and well-developed, well-nourished, and in no distress. No distress.  HENT:  Head: Normocephalic and atraumatic.  Mouth/Throat: No oropharyngeal exudate.  Eyes: Conjunctivae are normal. Pupils are equal, round, and reactive to light. No scleral icterus.  Neck: Normal range of motion. Neck supple. No JVD present.  Cardiovascular: Normal rate and normal heart sounds.  Exam reveals no gallop and no friction rub.   No murmur heard. Irregularly irregular rhythm  Pulmonary/Chest: Breath sounds normal. No respiratory distress. He has no wheezes. He has no rales.  Abdominal: Soft. Bowel sounds are normal. He exhibits no distension. There is no tenderness. There is no guarding.  Musculoskeletal: He exhibits edema. He exhibits no tenderness.  Lipoma on right side of mid back  Lymphadenopathy:    He has no cervical adenopathy.  Neurological: He is alert and oriented to person, place, and time. No cranial nerve deficit.  Skin: Skin is warm and dry. No rash noted. No erythema. No pallor.  Psychiatric: Affect and judgment normal.     LABORATORY DATA: I have personally reviewed the data as listed:  No visits with results within 1 Month(s) from this visit.  Latest known visit with results is:  Appointment on 05/04/2017  Component Date Value Ref Range Status  . WBC 05/04/2017 5.7  4.0 - 10.5 K/uL Final  . RBC 05/04/2017 3.84* 4.22 - 5.81 MIL/uL Final  . Hemoglobin 05/04/2017 12.0* 13.0 - 17.0 g/dL Final  . HCT 05/04/2017 36.6* 39.0 - 52.0 % Final  . MCV 05/04/2017 95.3  78.0 -  100.0 fL Final  . MCH 05/04/2017 31.3  26.0 - 34.0 pg Final  . MCHC 05/04/2017 32.8  30.0 - 36.0 g/dL Final  . RDW 05/04/2017 15.0  11.5 - 15.5 % Final  . Platelets 05/04/2017 190  150 - 400 K/uL Final  . Neutrophils Relative % 05/04/2017 68  % Final  . Neutro Abs 05/04/2017 3.9  1.7 - 7.7 K/uL Final  . Lymphocytes Relative 05/04/2017 18  % Final  . Lymphs Abs 05/04/2017 1.0  0.7 - 4.0 K/uL Final  . Monocytes Relative 05/04/2017 8  % Final  . Monocytes Absolute 05/04/2017 0.5  0.1 - 1.0 K/uL Final  . Eosinophils Relative 05/04/2017 5  % Final  . Eosinophils Absolute 05/04/2017 0.3  0.0 - 0.7 K/uL Final  . Basophils Relative 05/04/2017 1  % Final  . Basophils Absolute 05/04/2017 0.0  0.0 - 0.1 K/uL Final  . Sodium 05/04/2017 138  135 - 145 mmol/L Final  . Potassium 05/04/2017 4.4  3.5 - 5.1 mmol/L Final  . Chloride 05/04/2017 106  101 - 111 mmol/L Final  . CO2 05/04/2017 26  22 - 32 mmol/L Final  . Glucose, Bld 05/04/2017 110* 65 - 99 mg/dL Final  . BUN 05/04/2017 22* 6 - 20 mg/dL Final  . Creatinine, Ser 05/04/2017 1.28* 0.61 - 1.24 mg/dL Final  . Calcium 05/04/2017 8.8* 8.9 - 10.3 mg/dL Final  . Total Protein 05/04/2017 6.7  6.5 - 8.1 g/dL Final  . Albumin 05/04/2017 3.1* 3.5 - 5.0 g/dL Final  . AST 05/04/2017 14* 15 - 41 U/L Final  . ALT 05/04/2017 13* 17 - 63 U/L Final  . Alkaline Phosphatase 05/04/2017 68  38 - 126 U/L Final  . Total Bilirubin 05/04/2017 0.5  0.3 - 1.2 mg/dL Final  . GFR calc non Af Amer 05/04/2017 53* >60 mL/min Final  . GFR calc Af Amer 05/04/2017 >60  >60 mL/min Final   Comment: (NOTE) The eGFR has been calculated using the CKD EPI equation. This calculation has not been validated in all clinical situations. eGFR's persistently <60 mL/min signify possible Chronic Kidney Disease.   . Anion gap 05/04/2017 6  5 - 15 Final  . LDH 05/04/2017 134  98 - 192 U/L Final    RADIOGRAPHIC STUDIES: I have personally reviewed the radiological images as listed and  agree with the findings in the report  CT C/A/P 5/24 /18: IMPRESSION: 1. 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 2. Stable upper pole right renal mass with imaging features suspicious for renal cell carcinoma, measuring 8.8 x 6.3 cm in  the axial plane today. 3. No evidence of metastatic disease in the chest, abdomen or pelvis. 4. Cholelithiasis without evidence cholecystitis. 5. 4.5 cm infrarenal abdominal aortic aneurysm with a mild increase in size. Recommend followup by abdomen and pelvis CTA in 6 months, and vascular surgery referral/consultation if not already obtained. This recommendation follows ACR consensus guidelines: [name] on Vascular Findings. [name] 2013; 10:789-794. 6. Coronary artery and aortic atherosclerosis. 7. Sigmoid diverticulosis. 8. 4.3 cm right lobe thyroid nodule.   ASSESSMENT/PLAN 76 year old man with complex cystic right renal mass in the upper pole, suspicious for renal cell carcinoma. Based on his last MRI scans he is a clinical stage II (T2a N0 Mx), if this is malignancy.  Repeat CT C/A/P  05/05/17 demonstrated stable upper pole right renal mass with imaging features suspicious for renal cell carcinoma, measuring 8.8 x 6.3 cm in the axial plane today. No evidence of metastatic disease in the chest, abdomen or pelvis.  MSKCC Prognostic Model score: patient has 2 prognostic factors (karnofsky performance status <80% and hemoglobin less than lower limites of normal) which puts him in the intermediate risk group.   PLAN: -Proceed with surgical resection of renal mass by his urologist. -Return to clinic in 2 months for follow-up.  All questions were answered. The patient knows to call the clinic with any problems, questions or concerns.  This note was electronically signed.    Twana First, MD  06/20/2017 3:21 PM

## 2017-06-22 ENCOUNTER — Encounter: Payer: Self-pay | Admitting: *Deleted

## 2017-06-22 ENCOUNTER — Ambulatory Visit (INDEPENDENT_AMBULATORY_CARE_PROVIDER_SITE_OTHER): Payer: Medicare Other | Admitting: Cardiovascular Disease

## 2017-06-22 ENCOUNTER — Encounter: Payer: Self-pay | Admitting: Cardiovascular Disease

## 2017-06-22 ENCOUNTER — Telehealth: Payer: Self-pay | Admitting: Cardiovascular Disease

## 2017-06-22 VITALS — BP 140/64 | HR 70 | Ht 73.0 in | Wt 288.0 lb

## 2017-06-22 DIAGNOSIS — I2510 Atherosclerotic heart disease of native coronary artery without angina pectoris: Secondary | ICD-10-CM | POA: Diagnosis not present

## 2017-06-22 DIAGNOSIS — I714 Abdominal aortic aneurysm, without rupture, unspecified: Secondary | ICD-10-CM

## 2017-06-22 DIAGNOSIS — Z01818 Encounter for other preprocedural examination: Secondary | ICD-10-CM | POA: Diagnosis not present

## 2017-06-22 DIAGNOSIS — I4891 Unspecified atrial fibrillation: Secondary | ICD-10-CM | POA: Diagnosis not present

## 2017-06-22 NOTE — Patient Instructions (Signed)
Medication Instructions:  Continue all current medications.  Labwork: none  Testing/Procedures:  Your physician has requested that you have an abdominal aorta duplex. During this test, an ultrasound is used to evaluate the aorta. Allow 30 minutes for this exam. Do not eat after midnight the day before and avoid carbonated beverages.  (Due in 6 months.)  Your physician has requested that you have a lexiscan myoview. For further information please visit [email or web address]. Please follow instruction sheet, as given.  Office will contact with results via phone or letter.    Follow-Up: 3 months   Any Other Special Instructions Will Be Listed Below (If Applicable).  If you need a refill on your cardiac medications before your next appointment, please call your pharmacy.

## 2017-06-22 NOTE — Telephone Encounter (Signed)
Pre-cert Verification for the following procedure   Lexiscan myoview   Set for 06-29-17  At [facility].

## 2017-06-22 NOTE — Progress Notes (Signed)
CARDIOLOGY CONSULT NOTE  Patient ID: Vincent Pope MRN: [id number] DOB/AGE: 77-Oct-1942 77 y.o.  Admit date: (Not on file) Primary Physician: Hilbert Corrigan, MD Referring Physician: [place]  Reason for Consultation: preop clearance  HPI: Vincent Pope is a 76 y.o. male who is being seen today for the evaluation of preoperative risk stratification at the request of [name].   He was found to have a right renal mass suspicious for renal cell carcinoma with plans for possible nephrectomy.  He reportedly has a history of atrial fibrillation.  Chest CT 05/05/17 showed atheromatous arterial calcifications including the coronary arteries and aorta. Borderline enlarged heart was also reported. He was also found to have a 4.5 cm infrarenal abdominal aortic aneurysm.  ECG performed in the office today which I ordered and personally interpreted demonstrated rate controlled atrial fibrillation, 79 bpm, possible old inferior infarct and late R-wave progression with nonspecific ST segment and T-wave abnormalities.  He tells me he saw a cardiologist several years ago. He said at one time he was on an anticoagulant and this was then switched to aspirin.  He walks with a walker. He denies chest pain, palpitations, shortness of breath, and syncope.  He resides at [facility].    No Known Allergies  Current Outpatient Prescriptions  Medication Sig Dispense Refill  . Amino Acids-Protein Hydrolys (FEEDING SUPPLEMENT, PRO-STAT SUGAR FREE 64,) LIQD Take 30 mLs by mouth 3 (three) times daily with meals.    Marland Kitchen aspirin 81 MG chewable tablet Chew 81 mg by mouth daily.    . Cholecalciferol (VITAMIN D3) 1000 units CAPS Take 1 capsule by mouth daily.    . furosemide (LASIX) 40 MG tablet Take 40 mg by mouth daily.    Marland Kitchen lisinopril (PRINIVIL,ZESTRIL) 20 MG tablet Take 20 mg by mouth daily.    . metoprolol tartrate (LOPRESSOR) 25 MG tablet Take 25 mg by mouth 2 (two) times  daily.    . pantoprazole (PROTONIX) 40 MG tablet Take 40 mg by mouth daily.    . polyethylene glycol (MIRALAX / GLYCOLAX) packet Take 17 g by mouth daily.    . tamsulosin (FLOMAX) 0.4 MG CAPS capsule Take 0.4 mg by mouth daily.    . traMADol (ULTRAM) 50 MG tablet Take 50 mg by mouth every 6 (six) hours as needed.     No current facility-administered medications for this visit.     Past Medical History:  Diagnosis Date  . 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 Atherosclerosis of aorta ([place])   . Atrial fibrillation ([place])   . Benign prostatic hyperplasia   . Cancer ([place])   . Cognitive communication deficit   . Congestive heart failure (CHF) ([place])   . Diverticulitis   . Hypertension, essential   . Neoplasm of right kidney   . Thrombocytopenia ([place])   . Vitamin D deficiency     Past Surgical History:  Procedure Laterality Date  . CATARACT EXTRACTION Bilateral     Social History   Social History  . Marital status: Married    Spouse name: N/A  . Number of children: N/A  . Years of education: N/A   Occupational History  . Not on file.   Social History Main Topics  . Smoking status: Former Research scientist (life sciences)  . Smokeless tobacco: Never Used  . Alcohol use No  . Drug use: No  . Sexual activity: Not Currently   Other Topics Concern  . Not on file   Social History  Narrative  . No narrative on file     No family history of premature CAD in 1st degree relatives.  Current Meds  Medication Sig  . Amino Acids-Protein Hydrolys (FEEDING SUPPLEMENT, PRO-STAT SUGAR FREE 64,) LIQD Take 30 mLs by mouth 3 (three) times daily with meals.  Marland Kitchen aspirin 81 MG chewable tablet Chew 81 mg by mouth daily.  . Cholecalciferol (VITAMIN D3) 1000 units CAPS Take 1 capsule by mouth daily.  . furosemide (LASIX) 40 MG tablet Take 40 mg by mouth daily.  Marland Kitchen lisinopril (PRINIVIL,ZESTRIL) 20 MG tablet Take 20 mg by mouth daily.  . metoprolol tartrate (LOPRESSOR) 25 MG tablet Take 25 mg by mouth 2 (two) times  daily.  . pantoprazole (PROTONIX) 40 MG tablet Take 40 mg by mouth daily.  . polyethylene glycol (MIRALAX / GLYCOLAX) packet Take 17 g by mouth daily.  . tamsulosin (FLOMAX) 0.4 MG CAPS capsule Take 0.4 mg by mouth daily.  . traMADol (ULTRAM) 50 MG tablet Take 50 mg by mouth every 6 (six) hours as needed.      Review of systems complete and found to be negative unless listed above in HPI    Physical exam Height 6\' 1"  (1.854 m), weight 288 lb (130.6 kg). General: NAD Neck: No JVD, no thyromegaly or thyroid nodule.  Lungs: Clear to auscultation bilaterally with normal respiratory effort. CV: Nondisplaced PMI. Regular rate and irregular rhythm, normal S1/S2, no S3, no murmur.  No peripheral edema.  No carotid bruit.    Abdomen: Soft, nontender, no distention.  Skin: Intact without lesions or rashes.  Neurologic: Alert and oriented x 3.  Psych: Normal affect. Extremities: No clubbing or cyanosis.  HEENT: Normal.   ECG: Most recent ECG reviewed.   Labs: Lab Results  Component Value Date/Time   K 4.4 05/04/2017 01:24 PM   BUN 22 (H) 05/04/2017 01:24 PM   CREATININE 1.28 (H) 05/04/2017 01:24 PM   ALT 13 (L) 05/04/2017 01:24 PM   HGB 12.0 (L) 05/04/2017 01:24 PM     Lipids: No results found for: LDLCALC, LDLDIRECT, CHOL, TRIG, HDL      ASSESSMENT AND PLAN:  1. Preoperative risk stratification: Unable to assess exercise tolerance. Aortic atherosclerosis and coronary artery calcifications seen on CT scan as detailed above. I will obtain a Lexiscan Myoview to evaluate for ischemic heart disease and provide more accurate risk stratification.  2. Atrial fibrillation: Heart rate is controlled on metoprolol 25 mg twice daily. He is not on anticoagulation and given reports of gross hematuria with likely renal cell carcinoma, I will not initiate at this time. Once he undergoes nephrectomy and urology feels it is permissible, I would then consider initiating anticoagulation due to  elevated thromboembolic risk.  3. Abdominal aortic: I will obtain an abdominal ultrasound in 6 months to assess for interval progression.  Disposition: Follow up in 3 months  Signed: Kate Sable, M.D., F.A.C.C.  06/22/2017, 10:37 AM

## 2017-06-22 NOTE — Addendum Note (Signed)
Addended by: Laurine Blazer on: 06/22/2017 11:36 AM   Modules accepted: Orders

## 2017-06-29 ENCOUNTER — Encounter (HOSPITAL_COMMUNITY): Payer: Self-pay

## 2017-06-29 ENCOUNTER — Encounter (HOSPITAL_COMMUNITY)
Admission: RE | Admit: 2017-06-29 | Discharge: 2017-06-29 | Disposition: A | Discharge status: Home / Self Care | Payer: Medicare Other | Source: Ambulatory Visit | Attending: Cardiovascular Disease | Admitting: Cardiovascular Disease

## 2017-06-29 ENCOUNTER — Encounter (HOSPITAL_BASED_OUTPATIENT_CLINIC_OR_DEPARTMENT_OTHER)
Admission: RE | Admit: 2017-06-29 | Discharge: 2017-06-29 | Disposition: A | Discharge status: Home / Self Care | Payer: Medicare Other | Source: Ambulatory Visit | Attending: Cardiovascular Disease | Admitting: Cardiovascular Disease

## 2017-06-29 DIAGNOSIS — I4891 Unspecified atrial fibrillation: Secondary | ICD-10-CM | POA: Diagnosis not present

## 2017-06-29 DIAGNOSIS — I2510 Atherosclerotic heart disease of native coronary artery without angina pectoris: Secondary | ICD-10-CM | POA: Diagnosis not present

## 2017-06-29 DIAGNOSIS — Z01818 Encounter for other preprocedural examination: Secondary | ICD-10-CM | POA: Insufficient documentation

## 2017-06-29 LAB — NM MYOCAR MULTI W/SPECT W/WALL MOTION / EF
CHL CUP NUCLEAR SDS: 5
CHL CUP NUCLEAR SRS: 2
CHL CUP NUCLEAR SSS: 7
CSEPPHR: 69 {beats}/min
LV dias vol: 148 mL (ref 62–150)
LV sys vol: 51 mL
RATE: 0.33
Rest HR: 59 {beats}/min
TID: 0.88

## 2017-06-29 MED ORDER — SODIUM CHLORIDE 0.9% FLUSH
INTRAVENOUS | Status: AC
  Administered 2017-06-29: 10 mL via INTRAVENOUS
  Filled 2017-06-29: qty 10

## 2017-06-29 MED ORDER — REGADENOSON 0.4 MG/5ML IV SOLN
INTRAVENOUS | Status: AC
  Administered 2017-06-29: 0.4 mg via INTRAVENOUS
  Filled 2017-06-29: qty 5

## 2017-06-29 MED ORDER — TECHNETIUM TC 99M TETROFOSMIN IV KIT
10.0000 | PACK | Freq: Once | INTRAVENOUS | Status: AC | PRN
  Administered 2017-06-29: 10.3 via INTRAVENOUS

## 2017-06-29 MED ORDER — TECHNETIUM TC 99M TETROFOSMIN IV KIT
30.0000 | PACK | Freq: Once | INTRAVENOUS | Status: AC | PRN
Start: 2017-06-29 — End: 2017-06-29
  Administered 2017-06-29: 32.8 via INTRAVENOUS

## 2017-06-30 ENCOUNTER — Telehealth: Payer: Self-pay | Admitting: *Deleted

## 2017-06-30 NOTE — Telephone Encounter (Signed)
Notes recorded by Laurine Blazer, LPN on 5/63/8756 at 43:32 AM EDT Pricilla Holm, LPN with [facility] notified. Copy to pmd. Note faxed to her facility at [phone]. ------  Notes recorded by Laurine Blazer, LPN on 06/11/1600 at 0:93 AM EDT Left message for his nurse to call back from [facility]. ------  Notes recorded by Herminio Commons, MD on 06/29/2017 at 3:45 PM EDT Intermediate risk suggestive of possible old heart attack with some mild blockages now. Would still manage with medications. He can proceed with urologic surgery with an acceptable level of risk.

## 2017-07-12 ENCOUNTER — Other Ambulatory Visit: Payer: Self-pay | Admitting: Urology

## 2017-08-18 NOTE — Progress Notes (Signed)
Spoke to Doristine Johns, LPN at [facility] requesting most resent [facility], progress note , and any labs done in last 30 days Faxed to me at [phone].

## 2017-08-19 ENCOUNTER — Other Ambulatory Visit: Payer: Self-pay | Admitting: Surgery

## 2017-08-19 ENCOUNTER — Encounter (HOSPITAL_COMMUNITY): Payer: Self-pay | Admitting: *Deleted

## 2017-08-19 DIAGNOSIS — E041 Nontoxic single thyroid nodule: Secondary | ICD-10-CM

## 2017-08-19 NOTE — Progress Notes (Signed)
Spoke with Doristine Johns, LPN about pre-op instructions and she verbalized understanding of instructions Faxed to her for patient.

## 2017-08-19 NOTE — Progress Notes (Addendum)
Preop instructions for: Vincent Pope                        Date of Birth - [birth year]                            Date of Procedure: Monday 08/22/2017       Doctor:[name]  Time to arrive at [place] am  Report to: Admitting  Procedure: Laparoscopic Radical Nephrectomy-right  Any procedure time changes, MD office will notify you!   Do not eat or drink past midnight the night before your procedure.(To include any tube feedings-must be discontinued)  Reminder:Follow bowel prep instructions per MD office!   Take these morning medications only with sips of water.(or give through gastrostomy or feeding tube). Metoprolol, Protonix  Note: No Insulin or Diabetic meds should be given or taken the morning of the procedure!   Facility contact: Sheldon Silvan -brother for Emergency contact                  Phone: [phone]   Health Care POA: none as patient signs own papers  Transportation contact phone#: Lamount Cohen at [phone]  Please send day of procedure:current med list and meds last taken that day, confirm nothing by mouth status from what time, Patient Demographic info( to include DNR status, problem list, allergies)   RN contact name/phone#: Pricilla Holm, LPN                             [phone] and Fax #: [phone]  Bring Insurance card and picture ID Leave all jewelry and other valuables at place where living( no metal or rings to be worn) No contact lens Women-no make-up, no lotions,perfumes,powders Men-no colognes,lotions  Any questions day of procedure,call [facility]  [phone]!   Sent from :[facility]                   [place]                  Fax:[phone]  Sent by Charmaine Downs. Tobin Chad

## 2017-08-19 NOTE — H&P (Signed)
CC/HPI: CC: Right renal neoplasm    Vincent Pope is a 76 year old gentleman who has had a known complex right renal neoplasm dating back to 2011. He was evaluated by Dr. Clyde Lundborg in [place], [place] in 2015 and a CT scan at that time demonstrated a 6.5 cm complex upper pole right renal mass suspicious for renal cell carcinoma. He apparently was not felt to be a surgical candidate. He was seen initially by [name] in February 2018 at which time he was residing in a rehabilitation facility. He was unable to provide much of a history but it was determined that he had a growing renal mass now measuring 8.6 cm based on an MRI in January 2018. He was wheelchair bound at the time and it was felt that his performance status again did not make him a surgical candidate. He has since been followed by [name] in medical oncology. It was felt that his performance status had improved recently and he was referred back to [name] in June 2018. A CT scan from 05/05/17 continued to show a growing right renal mass now measuring 8.8 cm. Additional findings indicated a 4.3 cm thyroid mass, a 4.5 cm (increased from 4.2 cm in January 2018) AAA, multiple large hepatic cysts (largest is 9 cm), and a 1.2 cm gallstone. His chest CT did not demonstrate pulmonary nodules and no other evidence of metastatic disease has been noted.   He did recently see Dr. Bronson Ing with cardiology. He underwent a nuclear medicine stress test that demonstrated multiple areas that suggest fixed defects without any clearly inducible ischemic areas. He did have a preserved ejection fraction of 66%. He also is scheduled to proceed with a follow-up ultrasound of his abdominal aneurysm in 6 months. Overall, he was felt to be at an acceptable but intermediate risk for perioperative complications with major noncardiac surgery.   He also was recently seen by [name]. His laboratory studies indicated a hemoglobin of 12.0, platelet count of 190,000,  serum creatinine 1.28 with estimated GFR of 53. His liver function tests were not elevated.   Family history of kidney cancer: No  Family history of ESRD: No   Imaging: CT chest and abdomen (05/05/17)  Side of renal neoplasm: Right  Size of renal neoplasm: 8.8 cm  Location of renal neoplasm: Right upper pole extending to mid kidney and posteriorly up to renal hilum  Exophytic or endophytic: Both   Renal artery anatomy: Single renal artery  Renal vein anatomy: Single renal vein   Contralateral renal lesions: 7 mm left renal calculus in renal pelvis, simple small renal cysts  Regional lymphadenopathy: None.  Adrenal masses: None.  Renal vein/IVC involvement: No.  Metastatic disease to the abdomen: No.   Chest imaging: Negative (CT- 05/05/17)  LFTs: Normal   Baseline renal function: Cr 1.28 , eGFR 53 ml/min   PMH: Past medical history is significant for CHF (with chronic severe LE edema), atrial fibrillation, diverticulitis, and thrombocytopenia.  PSH: No abdominal surgeries.     ALLERGIES: None   MEDICATIONS: Metoprolol Tartrate 25 mg tablet  Adult Robitussin Peak Cold Dm  Aspir 81 81 mg tablet, delayed release  Flomax 0.4 mg capsule, ext release 24 hr  Lasix 40 mg tablet  Miralax 17 gram powder in packet  Prinivil 20 mg tablet  Pro Stat  Protonix 40 mg tablet, delayed release  Tylenol Arthritis 650 mg tablet, extended release  Vitamin D3 1,000 unit capsule  GU PSH: None   NON-GU PSH: None   GU PMH: Right renal neoplasm, large right-sided renal tumor most likely renal cell carcinoma Initially, the patient was definitely not a surgical candidate for this. Now,I'm still not sure but his condition has improved. 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 Has not smoked since 01/13/1987.   Tobacco Use Assessment Completed: Used Tobacco in last 30 days? Has never drank.  Drinks 1 caffeinated drink per day.    REVIEW OF SYSTEMS:    GU Review Male:   Patient denies frequent urination, hard to postpone urination, burning/ pain with urination, get up at night to urinate, leakage of urine, stream starts and stops, trouble starting your streams, and have to strain to urinate .  Gastrointestinal (Upper):   Patient denies nausea and vomiting.  Gastrointestinal (Lower):   Patient denies diarrhea and constipation.  Constitutional:   Patient denies fever, night sweats, weight loss, and fatigue.  Skin:   Patient denies skin rash/ lesion and itching.  Eyes:   Patient denies double vision and blurred vision.  Ears/ Nose/ Throat:   Patient denies sore throat and sinus problems.  Hematologic/Lymphatic:   Patient denies swollen glands and easy bruising.  Cardiovascular:   Patient denies leg swelling and chest pains.  Respiratory:   Patient denies cough and shortness of breath.  Endocrine:   Patient denies excessive thirst.  Musculoskeletal:   Patient denies back pain and joint pain.  Neurological:   Patient denies headaches and dizziness.  Psychologic:   Patient denies depression and anxiety.   VITAL SIGNS:     Weight 275 lb / 124.74 kg  Height 73 in / 185.42 cm  BMI 36.3 kg/m   MULTI-SYSTEM PHYSICAL EXAMINATION:    Constitutional: Well-nourished. No physical deformities. Normally developed. He is in a wheelchair. However, he states that he is able to ambulate with a walker without significant difficulty.  Neck: Neck symmetrical, not swollen. Normal tracheal position. There is fullness in the left  neck without a definitive neck mass.  Respiratory: No labored breathing, no use of accessory muscles. Clear bilaterally.  Cardiovascular: Normal temperature, normal extremity pulses, no varicosities. Irregularly, irregular. He does have severe bilateral lower extremity edema.  Lymphatic: No enlargement, no tenderness of axillae, groin, neck lymph nodes.  Skin: No paleness, no jaundice, no cyanosis. No lesion, no ulcer, no rash. He does have a large lipoma in his mid thoracic back. He also has a smaller lipoma located over his left scapula.  Neurologic / Psychiatric: Oriented to time, oriented to place, oriented to person. No depression, no anxiety, no agitation.  Gastrointestinal: No mass, no tenderness, no rigidity. Obese with a reducible umbilical hernia.  Eyes: Normal conjunctivae. Normal eyelids.  Ears, Nose, Mouth, and Throat: Left ear no scars, no lesions, no masses. Right ear no scars, no lesions, no masses. Nose no scars, no lesions, no masses. Normal hearing. Normal lips.  Musculoskeletal: Normal gait and station of head and neck.       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 Right renal neoplasm suspicious for malignancy: Vincent Pope performance status appears to have improved compared to his baseline. Based on his recent cardiac evaluation, he would appear to be an acceptable candidate to consider a laparoscopic right radical nephrectomy for treatment of his suspicious right renal mass. This is certainly concerning for renal cell carcinoma considering its appearance on imaging and its significant increase in size over the past few years.   The patient was provided information regarding their renal mass including the relative risk of benign versus malignant pathology and the natural history of renal cell carcinoma and other possible  malignancies of the kidney. The role of renal biopsy, laboratory testing, and imaging studies to further characterize renal masses and/or the presence of metastatic disease were explained. We discussed the role of active surveillance, surgical therapy with both radical nephrectomy and nephron-sparing surgery, and ablative therapy in the treatment of renal masses. In addition, we discussed our goals of providing an accurate diagnosis and oncologic control while maintaining optimal renal function as appropriate based on the size, location, and complexity of their renal mass as well as their co-morbidities.  We have discussed the risks of treatment in detail including but not limited to bleeding, infection, heart attack, stroke, death, venothromoboembolism, cancer recurrence, injury/damage to surrounding organs and structures, urine leak, the possibility of open surgical conversion for patients undergoing minimally invasive surgery, the risk of developing chronic kidney disease and its associated implications, and the potential risk of end stage renal disease possibly necessitating dialysis.   He is agreeable to proceeding with surgical treatment of his present kidney cancer. I have recommended that he see Dr. Armandina Gemma for further evaluation of his thyroid mass prior to proceeding with surgery in order to rule this out as a potential secondary malignancy or an unusual solitary metastatic lesion. Vincent Pope understands that this may require a biopsy of his thyroid mass. I also asked Dr. Harlow Asa to determine whether he thought a cholecystectomy at the time of his right laparoscopic nephrectomy would be indicated considering his known gallstone.   In the meantime, he will be tentatively scheduled for a right laparoscopic radical nephrectomy.   2. Left renal calculus: We did discuss his nonobstructing 7 mm left renal calculus. I expressed the concern regarding this finding in the setting of a solitary left  kidney following right nephrectomy. He understands that ureteral obstruction would result in acute renal failure in that setting may require urgent treatment. We discussed the option of proceeding with treatment of his renal calculus prophylactically prior to his nephrectomy versus the approach of continuing observation. Considering his overall medical condition, we agreed that trying to avoid an additional procedure would be in his best interest.

## 2017-08-21 MED ORDER — DEXTROSE 5 % IV SOLN
3.0000 g | INTRAVENOUS | Status: AC
  Administered 2017-08-22: 3 g via INTRAVENOUS
  Filled 2017-08-21 (×2): qty 3000

## 2017-08-22 ENCOUNTER — Encounter (HOSPITAL_COMMUNITY): Payer: Self-pay | Admitting: Anesthesiology

## 2017-08-22 ENCOUNTER — Inpatient Hospital Stay (HOSPITAL_COMMUNITY): Payer: Medicare Other | Admitting: Anesthesiology

## 2017-08-22 ENCOUNTER — Inpatient Hospital Stay (HOSPITAL_COMMUNITY)
Admission: RE | Admit: 2017-08-22 | Discharge: 2017-08-24 | DRG: 658 | Disposition: A | Discharge status: Skilled Nursing Facility | Payer: Medicare Other | Source: Ambulatory Visit | Attending: Urology | Admitting: Urology

## 2017-08-22 ENCOUNTER — Encounter (HOSPITAL_COMMUNITY)
Admission: RE | Disposition: A | Discharge status: Skilled Nursing Facility | Payer: Self-pay | Source: Ambulatory Visit | Attending: Urology

## 2017-08-22 DIAGNOSIS — I482 Chronic atrial fibrillation: Secondary | ICD-10-CM | POA: Diagnosis present

## 2017-08-22 DIAGNOSIS — D49512 Neoplasm of unspecified behavior of left kidney: Secondary | ICD-10-CM | POA: Diagnosis present

## 2017-08-22 DIAGNOSIS — N200 Calculus of kidney: Secondary | ICD-10-CM | POA: Diagnosis present

## 2017-08-22 DIAGNOSIS — I110 Hypertensive heart disease with heart failure: Secondary | ICD-10-CM | POA: Diagnosis present

## 2017-08-22 DIAGNOSIS — E669 Obesity, unspecified: Secondary | ICD-10-CM | POA: Diagnosis present

## 2017-08-22 DIAGNOSIS — R001 Bradycardia, unspecified: Secondary | ICD-10-CM | POA: Diagnosis not present

## 2017-08-22 DIAGNOSIS — Z6836 Body mass index (BMI) 36.0-36.9, adult: Secondary | ICD-10-CM

## 2017-08-22 DIAGNOSIS — I714 Abdominal aortic aneurysm, without rupture: Secondary | ICD-10-CM | POA: Diagnosis present

## 2017-08-22 DIAGNOSIS — I509 Heart failure, unspecified: Secondary | ICD-10-CM | POA: Diagnosis present

## 2017-08-22 DIAGNOSIS — D49511 Neoplasm of unspecified behavior of right kidney: Secondary | ICD-10-CM | POA: Diagnosis present

## 2017-08-22 DIAGNOSIS — K429 Umbilical hernia without obstruction or gangrene: Secondary | ICD-10-CM | POA: Diagnosis present

## 2017-08-22 DIAGNOSIS — Z87891 Personal history of nicotine dependence: Secondary | ICD-10-CM

## 2017-08-22 HISTORY — PX: LAPAROSCOPIC NEPHRECTOMY: SHX1930

## 2017-08-22 HISTORY — DX: Pneumonia, unspecified organism: J18.9

## 2017-08-22 HISTORY — DX: Cardiac arrhythmia, unspecified: I49.9

## 2017-08-22 LAB — BASIC METABOLIC PANEL
ANION GAP: 7 (ref 5–15)
Anion gap: 7 (ref 5–15)
BUN: 44 mg/dL — AB (ref 6–20)
BUN: 43 mg/dL — ABNORMAL HIGH (ref 6–20)
CALCIUM: 8.7 mg/dL — AB (ref 8.9–10.3)
CO2: 25 mmol/L (ref 22–32)
CO2: 24 mmol/L (ref 22–32)
CREATININE: 1.93 mg/dL — AB (ref 0.61–1.24)
Calcium: 9.1 mg/dL (ref 8.9–10.3)
Chloride: 108 mmol/L (ref 101–111)
Chloride: 108 mmol/L (ref 101–111)
Creatinine, Ser: 1.97 mg/dL — ABNORMAL HIGH (ref 0.61–1.24)
GFR calc Af Amer: 36 mL/min — ABNORMAL LOW (ref >60)
GFR calc non Af Amer: 31 mL/min — ABNORMAL LOW (ref >60)
GFR, EST AFRICAN AMERICAN: 37 mL/min — AB (ref >60)
GFR, EST NON AFRICAN AMERICAN: 32 mL/min — AB (ref >60)
GLUCOSE: 97 mg/dL (ref 65–99)
Glucose, Bld: 126 mg/dL — ABNORMAL HIGH (ref 65–99)
Potassium: 4.3 mmol/L (ref 3.5–5.1)
Potassium: 4.4 mmol/L (ref 3.5–5.1)
Sodium: 140 mmol/L (ref 135–145)
Sodium: 139 mmol/L (ref 135–145)

## 2017-08-22 LAB — CBC
HCT: 36.9 % — ABNORMAL LOW (ref 39.0–52.0)
HEMOGLOBIN: 12.3 g/dL — AB (ref 13.0–17.0)
MCH: 29.6 pg (ref 26.0–34.0)
MCHC: 33.3 g/dL (ref 30.0–36.0)
MCV: 88.7 fL (ref 78.0–100.0)
Platelets: 209 10*3/uL (ref 150–400)
RBC: 4.16 MIL/uL — ABNORMAL LOW (ref 4.22–5.81)
RDW: 14.3 % (ref 11.5–15.5)
WBC: 5.8 10*3/uL (ref 4.0–10.5)

## 2017-08-22 LAB — TYPE AND SCREEN
ABO/RH(D): A POS
Antibody Screen: NEGATIVE

## 2017-08-22 LAB — HEMOGLOBIN AND HEMATOCRIT, BLOOD
HEMATOCRIT: 35.5 % — AB (ref 39.0–52.0)
HEMOGLOBIN: 11.4 g/dL — AB (ref 13.0–17.0)

## 2017-08-22 LAB — ABO/RH: ABO/RH(D): A POS

## 2017-08-22 LAB — MRSA PCR SCREENING: MRSA BY PCR: POSITIVE — AB

## 2017-08-22 SURGERY — NEPHRECTOMY, RADICAL, LAPAROSCOPIC, ADULT
Anesthesia: General | Laterality: Right

## 2017-08-22 MED ORDER — FUROSEMIDE 40 MG PO TABS
40.0000 mg | ORAL_TABLET | Freq: Every day | ORAL | Status: DC
  Administered 2017-08-22 – 2017-08-24 (×3): 40 mg via ORAL
  Filled 2017-08-22 (×3): qty 1

## 2017-08-22 MED ORDER — SUGAMMADEX SODIUM 500 MG/5ML IV SOLN
INTRAVENOUS | Status: AC
Start: 2017-08-22 — End: 2017-08-22
  Filled 2017-08-22: qty 5

## 2017-08-22 MED ORDER — DIPHENHYDRAMINE HCL 50 MG/ML IJ SOLN: 12.5000 mg | Freq: Four times a day (QID) | INTRAMUSCULAR | Status: DC | PRN

## 2017-08-22 MED ORDER — PRO-STAT SUGAR FREE PO LIQD
30.0000 mL | Freq: Three times a day (TID) | ORAL | Status: DC
  Administered 2017-08-22 – 2017-08-24 (×5): 30 mL via ORAL
  Filled 2017-08-22 (×5): qty 30

## 2017-08-22 MED ORDER — ROCURONIUM BROMIDE 10 MG/ML (PF) SYRINGE
PREFILLED_SYRINGE | INTRAVENOUS | Status: DC | PRN
  Administered 2017-08-22: 10 mg via INTRAVENOUS
  Administered 2017-08-22: 50 mg via INTRAVENOUS
  Administered 2017-08-22: 20 mg via INTRAVENOUS
  Administered 2017-08-22 (×5): 10 mg via INTRAVENOUS

## 2017-08-22 MED ORDER — FENTANYL CITRATE (PF) 100 MCG/2ML IJ SOLN
25.0000 ug | INTRAMUSCULAR | Status: DC | PRN
  Administered 2017-08-22 (×3): 50 ug via INTRAVENOUS

## 2017-08-22 MED ORDER — DOCUSATE SODIUM 100 MG PO CAPS
100.0000 mg | ORAL_CAPSULE | Freq: Two times a day (BID) | ORAL | Status: DC
  Administered 2017-08-22 – 2017-08-24 (×4): 100 mg via ORAL
  Filled 2017-08-22 (×4): qty 1

## 2017-08-22 MED ORDER — CHLORHEXIDINE GLUCONATE CLOTH 2 % EX PADS
6.0000 | MEDICATED_PAD | Freq: Every day | CUTANEOUS | Status: DC
  Administered 2017-08-23 – 2017-08-24 (×2): 6 via TOPICAL

## 2017-08-22 MED ORDER — LISINOPRIL 20 MG PO TABS
20.0000 mg | ORAL_TABLET | Freq: Every day | ORAL | Status: DC
  Administered 2017-08-22 – 2017-08-24 (×2): 20 mg via ORAL
  Filled 2017-08-22 (×3): qty 1

## 2017-08-22 MED ORDER — METOPROLOL TARTRATE 25 MG PO TABS
25.0000 mg | ORAL_TABLET | Freq: Two times a day (BID) | ORAL | Status: DC
  Administered 2017-08-22 – 2017-08-23 (×2): 25 mg via ORAL
  Filled 2017-08-22 (×2): qty 1

## 2017-08-22 MED ORDER — ROCURONIUM BROMIDE 50 MG/5ML IV SOSY
PREFILLED_SYRINGE | INTRAVENOUS | Status: AC
  Filled 2017-08-22: qty 5

## 2017-08-22 MED ORDER — BUPIVACAINE LIPOSOME 1.3 % IJ SUSP
20.0000 mL | Freq: Once | INTRAMUSCULAR | Status: AC
  Administered 2017-08-22: 20 mL
  Filled 2017-08-22: qty 20

## 2017-08-22 MED ORDER — HYDROMORPHONE HCL-NACL 0.5-0.9 MG/ML-% IV SOSY: 0.5000 mg | PREFILLED_SYRINGE | INTRAVENOUS | Status: DC | PRN

## 2017-08-22 MED ORDER — LIDOCAINE 2% (20 MG/ML) 5 ML SYRINGE
INTRAMUSCULAR | Status: DC | PRN
  Administered 2017-08-22: 100 mg via INTRAVENOUS

## 2017-08-22 MED ORDER — CEFAZOLIN SODIUM-DEXTROSE 1-4 GM/50ML-% IV SOLN
1.0000 g | Freq: Three times a day (TID) | INTRAVENOUS | Status: AC
  Administered 2017-08-22 – 2017-08-23 (×2): 1 g via INTRAVENOUS
  Filled 2017-08-22 (×2): qty 50

## 2017-08-22 MED ORDER — ONDANSETRON HCL 4 MG/2ML IJ SOLN: 4.0000 mg | INTRAMUSCULAR | Status: DC | PRN

## 2017-08-22 MED ORDER — ENOXAPARIN SODIUM 40 MG/0.4ML ~~LOC~~ SOLN
40.0000 mg | SUBCUTANEOUS | Status: DC
  Administered 2017-08-23 – 2017-08-24 (×2): 40 mg via SUBCUTANEOUS
  Filled 2017-08-22 (×2): qty 0.4

## 2017-08-22 MED ORDER — ACETAMINOPHEN 10 MG/ML IV SOLN
1000.0000 mg | Freq: Four times a day (QID) | INTRAVENOUS | Status: DC
  Administered 2017-08-22 – 2017-08-23 (×3): 1000 mg via INTRAVENOUS
  Filled 2017-08-22 (×3): qty 100

## 2017-08-22 MED ORDER — PROPOFOL 10 MG/ML IV BOLUS
INTRAVENOUS | Status: AC
  Filled 2017-08-22: qty 20

## 2017-08-22 MED ORDER — DIPHENHYDRAMINE HCL 12.5 MG/5ML PO ELIX: 12.5000 mg | ORAL_SOLUTION | Freq: Four times a day (QID) | ORAL | Status: DC | PRN

## 2017-08-22 MED ORDER — MUPIROCIN 2 % EX OINT
1.0000 "application " | TOPICAL_OINTMENT | Freq: Two times a day (BID) | CUTANEOUS | Status: DC
  Administered 2017-08-22 – 2017-08-24 (×4): 1 via NASAL
  Filled 2017-08-22: qty 22

## 2017-08-22 MED ORDER — SUCCINYLCHOLINE CHLORIDE 200 MG/10ML IV SOSY
PREFILLED_SYRINGE | INTRAVENOUS | Status: DC | PRN
  Administered 2017-08-22: 140 mg via INTRAVENOUS

## 2017-08-22 MED ORDER — VITAMIN D3 25 MCG (1000 UNIT) PO TABS
1000.0000 [IU] | ORAL_TABLET | Freq: Every day | ORAL | Status: DC
  Administered 2017-08-22 – 2017-08-24 (×3): 1000 [IU] via ORAL
  Filled 2017-08-22 (×3): qty 1

## 2017-08-22 MED ORDER — VITAMIN D3 25 MCG (1000 UT) PO CAPS: 1.0000 | ORAL_CAPSULE | Freq: Every day | ORAL | Status: DC

## 2017-08-22 MED ORDER — HYDROCODONE-ACETAMINOPHEN 5-325 MG PO TABS: 1.0000 | ORAL_TABLET | Freq: Four times a day (QID) | ORAL | 0 refills | Status: DC | PRN

## 2017-08-22 MED ORDER — PHENYLEPHRINE 40 MCG/ML (10ML) SYRINGE FOR IV PUSH (FOR BLOOD PRESSURE SUPPORT)
PREFILLED_SYRINGE | INTRAVENOUS | Status: DC | PRN
  Administered 2017-08-22: 80 ug via INTRAVENOUS

## 2017-08-22 MED ORDER — FENTANYL CITRATE (PF) 100 MCG/2ML IJ SOLN
INTRAMUSCULAR | Status: DC | PRN
  Administered 2017-08-22: 25 ug via INTRAVENOUS
  Administered 2017-08-22: 50 ug via INTRAVENOUS
  Administered 2017-08-22: 25 ug via INTRAVENOUS
  Administered 2017-08-22: 50 ug via INTRAVENOUS
  Administered 2017-08-22: 100 ug via INTRAVENOUS

## 2017-08-22 MED ORDER — PANTOPRAZOLE SODIUM 40 MG PO TBEC
40.0000 mg | DELAYED_RELEASE_TABLET | Freq: Every day | ORAL | Status: DC
  Administered 2017-08-23 – 2017-08-24 (×2): 40 mg via ORAL
  Filled 2017-08-22 (×2): qty 1

## 2017-08-22 MED ORDER — LACTATED RINGERS IR SOLN
Status: DC | PRN
  Administered 2017-08-22: 1000 mL

## 2017-08-22 MED ORDER — ONDANSETRON HCL 4 MG/2ML IJ SOLN
INTRAMUSCULAR | Status: DC | PRN
  Administered 2017-08-22: 4 mg via INTRAVENOUS

## 2017-08-22 MED ORDER — DEXAMETHASONE SODIUM PHOSPHATE 10 MG/ML IJ SOLN
INTRAMUSCULAR | Status: DC | PRN
  Administered 2017-08-22: 10 mg via INTRAVENOUS

## 2017-08-22 MED ORDER — LACTATED RINGERS IV SOLN
INTRAVENOUS | Status: DC | PRN
  Administered 2017-08-22: 12:00:00 via INTRAVENOUS

## 2017-08-22 MED ORDER — SUGAMMADEX SODIUM 500 MG/5ML IV SOLN
INTRAVENOUS | Status: DC | PRN
  Administered 2017-08-22: 499.6 mg via INTRAVENOUS

## 2017-08-22 MED ORDER — ROCURONIUM BROMIDE 50 MG/5ML IV SOSY
PREFILLED_SYRINGE | INTRAVENOUS | Status: AC
  Filled 2017-08-22: qty 10

## 2017-08-22 MED ORDER — TAMSULOSIN HCL 0.4 MG PO CAPS
0.4000 mg | ORAL_CAPSULE | Freq: Every day | ORAL | Status: DC
  Administered 2017-08-22 – 2017-08-24 (×3): 0.4 mg via ORAL
  Filled 2017-08-22 (×3): qty 1

## 2017-08-22 MED ORDER — FENTANYL CITRATE (PF) 100 MCG/2ML IJ SOLN
INTRAMUSCULAR | Status: AC
  Filled 2017-08-22: qty 4

## 2017-08-22 MED ORDER — FENTANYL CITRATE (PF) 250 MCG/5ML IJ SOLN
INTRAMUSCULAR | Status: AC
  Filled 2017-08-22: qty 5

## 2017-08-22 MED ORDER — SODIUM CHLORIDE 0.9 % IJ SOLN
INTRAMUSCULAR | Status: AC
  Filled 2017-08-22: qty 20

## 2017-08-22 MED ORDER — EPHEDRINE SULFATE-NACL 50-0.9 MG/10ML-% IV SOSY
PREFILLED_SYRINGE | INTRAVENOUS | Status: DC | PRN
  Administered 2017-08-22 (×3): 5 mg via INTRAVENOUS

## 2017-08-22 MED ORDER — LACTATED RINGERS IV SOLN
INTRAVENOUS | Status: DC
  Administered 2017-08-22 (×3): via INTRAVENOUS

## 2017-08-22 MED ORDER — DEXTROSE-NACL 5-0.45 % IV SOLN
INTRAVENOUS | Status: DC
  Administered 2017-08-22 – 2017-08-23 (×3): via INTRAVENOUS

## 2017-08-22 MED ORDER — PROPOFOL 10 MG/ML IV BOLUS
INTRAVENOUS | Status: DC | PRN
  Administered 2017-08-22: 150 mg via INTRAVENOUS

## 2017-08-22 MED ORDER — ASPIRIN 81 MG PO CHEW
81.0000 mg | CHEWABLE_TABLET | Freq: Every day | ORAL | Status: DC
  Administered 2017-08-22 – 2017-08-24 (×3): 81 mg via ORAL
  Filled 2017-08-22 (×3): qty 1

## 2017-08-22 SURGICAL SUPPLY — 54 items
BAG LAPAROSCOPIC 12 15 PORT 16 (BASKET) IMPLANT
BAG RETRIEVAL 12/15 (BASKET)
BAG RETRIEVAL 12/15MM (BASKET)
BAG ZIPLOCK 12X15 (MISCELLANEOUS) ×3 IMPLANT
BLADE EXTENDED COATED 6.5IN (ELECTRODE) ×3 IMPLANT
BLADE SURG SZ10 CARB STEEL (BLADE) ×3 IMPLANT
CATH FOLEY 3WAY  5CC 18FR (CATHETERS)
CATH FOLEY 3WAY 5CC 18FR (CATHETERS) IMPLANT
CHLORAPREP W/TINT 26ML (MISCELLANEOUS) ×3 IMPLANT
CLIP VESOLOCK LG 6/CT PURPLE (CLIP) ×6 IMPLANT
CLIP VESOLOCK MED LG 6/CT (CLIP) ×6 IMPLANT
CLIP VESOLOCK XL 6/CT (CLIP) ×3 IMPLANT
COVER SURGICAL LIGHT HANDLE (MISCELLANEOUS) ×3 IMPLANT
CUTTER FLEX LINEAR 45M (STAPLE) ×3 IMPLANT
DERMABOND ADVANCED (GAUZE/BANDAGES/DRESSINGS) ×2
DERMABOND ADVANCED .7 DNX12 (GAUZE/BANDAGES/DRESSINGS) ×1 IMPLANT
DRAIN CHANNEL 10F 3/8 F FF (DRAIN) IMPLANT
DRAPE INCISE IOBAN 66X45 STRL (DRAPES) ×3 IMPLANT
DRAPE WARM FLUID 44X44 (DRAPE) IMPLANT
DRSG TEGADERM 4X4.75 (GAUZE/BANDAGES/DRESSINGS) IMPLANT
ELECT PENCIL ROCKER SW 15FT (MISCELLANEOUS) ×3 IMPLANT
ELECT REM PT RETURN 15FT ADLT (MISCELLANEOUS) ×3 IMPLANT
EVACUATOR SILICONE 100CC (DRAIN) IMPLANT
GLOVE BIO SURGEON STRL SZ 6.5 (GLOVE) ×2 IMPLANT
GLOVE BIO SURGEONS STRL SZ 6.5 (GLOVE) ×1
GLOVE BIOGEL M STRL SZ7.5 (GLOVE) ×3 IMPLANT
GOWN STRL REUS W/TWL LRG LVL3 (GOWN DISPOSABLE) ×12 IMPLANT
HEMOSTAT SURGICEL 4X8 (HEMOSTASIS) IMPLANT
IRRIG SUCT STRYKERFLOW 2 WTIP (MISCELLANEOUS) ×3
IRRIGATION SUCT STRKRFLW 2 WTP (MISCELLANEOUS) ×1 IMPLANT
KIT BASIN OR (CUSTOM PROCEDURE TRAY) ×3 IMPLANT
POUCH SPECIMEN RETRIEVAL 10MM (ENDOMECHANICALS) IMPLANT
RELOAD 45 VASCULAR/THIN (ENDOMECHANICALS) ×3 IMPLANT
RETRACTOR LAPSCP 12X46 CVD (ENDOMECHANICALS) IMPLANT
RTRCTR LAPSCP 12X46 CVD (ENDOMECHANICALS)
SCISSORS LAP 5X35 DISP (ENDOMECHANICALS) IMPLANT
SHEARS HARMONIC ACE PLUS 36CM (ENDOMECHANICALS) ×3 IMPLANT
SURGIFLO W/THROMBIN 8M KIT (HEMOSTASIS) IMPLANT
SUT CHROMIC 2 0 SH (SUTURE) IMPLANT
SUT ETHILON 3 0 PS 1 (SUTURE) IMPLANT
SUT MNCRL AB 4-0 PS2 18 (SUTURE) ×6 IMPLANT
SUT PDS AB 1 CTX 36 (SUTURE) ×6 IMPLANT
SUT VIC AB 2-0 SH 27 (SUTURE)
SUT VIC AB 2-0 SH 27X BRD (SUTURE) IMPLANT
SUT VICRYL 0 UR6 27IN ABS (SUTURE) ×3 IMPLANT
TOWEL OR 17X26 10 PK STRL BLUE (TOWEL DISPOSABLE) ×3 IMPLANT
TRAY LAPAROSCOPIC (CUSTOM PROCEDURE TRAY) ×3 IMPLANT
TROCAR BLADELESS OPT 5 100 (ENDOMECHANICALS) ×3 IMPLANT
TROCAR UNIVERSAL OPT 12M 100M (ENDOMECHANICALS) ×3 IMPLANT
TROCAR XCEL 12X100 BLDLESS (ENDOMECHANICALS) ×3 IMPLANT
TROCAR XCEL BLUNT TIP 100MML (ENDOMECHANICALS) ×3 IMPLANT
TUBING INSUF HEATED (TUBING) IMPLANT
URINEMETER 200ML W/220 (MISCELLANEOUS) IMPLANT
YANKAUER SUCT BULB TIP 10FT TU (MISCELLANEOUS) IMPLANT

## 2017-08-22 NOTE — Progress Notes (Signed)
Patient in A. Fib. Rhythm, dropping into 40's-50's. Tele notified me of a 2.27 second pause. Pt is asymptomatic. BP Stable. Vincent Pope with urology notified and instructed me to get an EKG if patient has another pause. Tele made aware to alert me if patient has another pause. Patient is currently resting comfortably. Will continue to monitor patient closely.

## 2017-08-22 NOTE — Plan of Care (Signed)
Problem: Safety: Goal: Ability to remain free from injury will improve Outcome: Completed/Met Date Met: 08/22/17 Bed alarm in place. Call bell within reach. Pt is aware to call for assistance when needing to get oob. Pt on bedrest for now

## 2017-08-22 NOTE — Discharge Instructions (Signed)

## 2017-08-22 NOTE — Transfer of Care (Signed)
Immediate Anesthesia Transfer of Care Note  Patient: Vincent Pope  Procedure(s) Performed: Procedure(s): LAPAROSCOPIC RADICAL NEPHRECTOMY (Right)  Patient Location: PACU  Anesthesia Type:General  Level of Consciousness:  sedated, patient cooperative and responds to stimulation  Airway & Oxygen Therapy:Patient Spontanous Breathing and Patient connected to face mask oxgen  Post-op Assessment:  Report given to PACU RN and Post -op Vital signs reviewed and stable  Post vital signs:  Reviewed and stable  Last Vitals:  Vitals:   08/22/17 0933  BP: 133/78  Pulse: (!) 54  Resp: 18  Temp: 37.3 C  SpO2: 70%    Complications: No apparent anesthesia complications

## 2017-08-22 NOTE — Anesthesia Preprocedure Evaluation (Signed)
Anesthesia Evaluation  Patient identified by MRN, date of birth, ID band Patient awake    Reviewed: Allergy & Precautions, NPO status   Airway Mallampati: II       Dental   Pulmonary pneumonia, former smoker,    breath sounds clear to auscultation       Cardiovascular hypertension, + Peripheral Vascular Disease and +CHF  + dysrhythmias  Rhythm:Regular Rate:Normal     Neuro/Psych    GI/Hepatic negative GI ROS, Neg liver ROS,   Endo/Other    Renal/GU Renal disease     Musculoskeletal   Abdominal   Peds  Hematology   Anesthesia Other Findings   Reproductive/Obstetrics                             Anesthesia Physical Anesthesia Plan  ASA: III  Anesthesia Plan: General   Post-op Pain Management:    Induction: Intravenous  PONV Risk Score and Plan: 2 and Ondansetron, Dexamethasone and Midazolam  Airway Management Planned: Oral ETT  Additional Equipment:   Intra-op Plan:   Post-operative Plan:   Informed Consent: I have reviewed the patients History and Physical, chart, labs and discussed the procedure including the risks, benefits and alternatives for the proposed anesthesia with the patient or authorized representative who has indicated his/her understanding and acceptance.   Dental advisory given  Plan Discussed with: CRNA and Anesthesiologist  Anesthesia Plan Comments:         Anesthesia Quick Evaluation

## 2017-08-22 NOTE — Anesthesia Postprocedure Evaluation (Signed)
Anesthesia Post Note  Patient: Vincent Pope  Procedure(s) Performed: Procedure(s) (LRB): LAPAROSCOPIC RADICAL NEPHRECTOMY (Right)     Patient location during evaluation: PACU Anesthesia Type: General Level of consciousness: awake Pain management: pain level controlled Vital Signs Assessment: post-procedure vital signs reviewed and stable Respiratory status: spontaneous breathing Cardiovascular status: stable Anesthetic complications: no    Last Vitals:  Vitals:   08/22/17 1545 08/22/17 1800  BP: (!) 134/55 (!) 141/69  Pulse: 64 60  Resp: 14   Temp: (!) 36.3 C 36.4 C  SpO2: 98% 98%    Last Pain:  Vitals:   08/22/17 1800  TempSrc: Oral  PainSc:                  Clay Solum

## 2017-08-22 NOTE — Progress Notes (Signed)
Patient arrived to unit. Pt is alert, oriented x4. Pt assessed and has wound to LLE  area, dressing changed prior 9/8 per date. Surgical site incisions x3 including midline, dermabond CDI.

## 2017-08-22 NOTE — Progress Notes (Signed)
Patient ID: Vincent Pope, male   DOB: 1941/12/03, 76 y.o.   MRN: [id number]  . Post-op note  Subjective: The patient is doing well.  No complaints.  Objective: Vital signs in last 24 hours: Temp:  [97.4 F (36.3 C)-99.2 F (37.3 C)] 97.4 F (36.3 C) (09/10 1545) Pulse Rate:  [54-64] 64 (09/10 1545) Resp:  [14-19] 14 (09/10 1545) BP: (123-137)/(51-78) 134/55 (09/10 1545) SpO2:  [97 %-100 %] 98 % (09/10 1545) Weight:  [124.9 kg (275 lb 6.4 oz)] 124.9 kg (275 lb 6.4 oz) (09/10 0933)  Intake/Output from previous day: No intake/output data recorded. Intake/Output this shift: Total I/O In: 1800 [I.V.:1800] Out: 525 [Urine:375; Blood:150]  Physical Exam:  General: Alert and oriented. Abdomen: Soft, Nondistended. Incisions: Clean and dry.  Lab Results:  Recent Labs  08/22/17 0948 08/22/17 1443  HGB 12.3* 11.4*  HCT 36.9* 35.5*    Assessment/Plan: POD#0   1) Continue to monitor, ambulate   Vincent Pope. MD   LOS: 0 days   Vincent Pope,LES 08/22/2017, 5:19 PM

## 2017-08-22 NOTE — Anesthesia Procedure Notes (Signed)
Procedure Name: Intubation Performed by: Saher Davee Pre-anesthesia Checklist: Patient identified, Emergency Drugs available, Suction available, Patient being monitored and Timeout performed Patient Re-evaluated:Patient Re-evaluated prior to induction Oxygen Delivery Method: Circle system utilized Preoxygenation: Pre-oxygenation with 100% oxygen Induction Type: IV induction Ventilation: Mask ventilation without difficulty Laryngoscope Size: Mac and 4 Grade View: Grade I Tube type: Oral Tube size: 7.5 mm Number of attempts: 1 Airway Equipment and Method: Stylet Placement Confirmation: ETT inserted through vocal cords under direct vision,  positive ETCO2,  CO2 detector and breath sounds checked- equal and bilateral Secured at: 23 cm Tube secured with: Tape Dental Injury: Teeth and Oropharynx as per pre-operative assessment

## 2017-08-22 NOTE — Op Note (Signed)
Preoperative diagnosis: Right renal neoplasm  Postoperative diagnosis: Right renal neoplasm  Procedure: 1.  Right laparoscopic radical nephrectomy  Surgeon: Pryor Curia. M.D.  Assistant(s): Debbrah Alar, PA-C  Anesthesia: General  Complications: None  EBL: 150 mL  IVF:  1500 mL crystalloid  Specimens: 1. Right kidney  Disposition of specimens: Pathology  Indication: Vincent Pope is a 76 y.o. patient with a right renal tumor suspicious for malignancy.  After a thorough review of the management options for their renal mass, they elected to proceed with surgical treatment and the above procedure.  We have discussed the potential benefits and risks of the procedure, side effects of the proposed treatment, the likelihood of the patient achieving the goals of the procedure, and any potential problems that might occur during the procedure or recuperation. Informed consent has been obtained.  Description of procedure:  The patient was taken to the operating room and a general anesthetic was administered. The patient was given preoperative antibiotics, placed in the right modified flank position, and prepped and draped in the usual sterile fashion. Next a preoperative timeout was performed.  A site was selected near the umbilicus for placement of the camera port. This was placed using a standard open Hassan technique which allowed entry into the peritoneal cavity under direct vision and without difficulty. A 12 mm Hassan cannula was placed and a pneumoperitoneum established. The camera was then used to inspect the abdomen and there was no evidence of any intra-abdominal injuries or other abnormalities. The remaining abdominal ports were then placed. The patient was quite obese.  A 12 mm port was placed in the right lower quadrant and a 5 mm port was placed in the right upper quadrant.  All ports were placed under direct vision without difficulty.  Utilizing the harmonic scalpel, the  white line of Toldt was incised allowing the colon to be mobilized medially and the plane between the mesocolon and the anterior layer of [name] to be developed and the kidney exposed.  The ureter and gonadal vein were not able to be easily identified due to the excessive retroperitoneal fat.  I was able to eventually identify the inferior vena cava superiorly and followed it down inferiorly.  Once I had exposed the anterior surface of the IVC, dissection proceeded superiorly along the IVC until the gonadal vein was identified where it was ligated with 5 mm Weck clips and divided.  The IVC was followed up superiorly until the renal vein was identified.  The renal hilum was then carefully isolated with a combination of blunt and sharp dissection allowing the renal arterial and venous structures to be separated and isolated.   The renal artery was located superiorly to the renal vein and was isolated and ligated with multiple Weck clips and subsequently divided.  The renal vein was then isolated and also ligated and divided with a 45 mm Flex ETS stapler. For this part of the procedure, I converted the upper quadrant 5 mm port to a 12 mm port to use for the stapler as this port offered more direct access to the renal vein.  [name] was intentionally entered superiorly and the space between the adrenal gland and the kidney was developed allowing the adrenal gland to be spared.  The hepatorenal ligaments were divided with the harmonic scalpel.  The lateral and posterior attachements to the kidney were then divided.  The ureter was ligated with Weck clips and divided allowing the specimen to be freed from all  surrounding structures.  The kidney specimen was then placed into a 12 mm retrieval bag.  The renal hilum, liver, adrenal bed and gonadal vein areas were each inspected and hemostasis was ensured with the pneomperitoneal pressures lowered.  The 12 mm lower quadrant and upper quadrant ports were  then closed with a 0-vicryl suture placed laparoscopically to close the fascia of this incision. All remaining ports were removed under direct vision.  The kidney specimen was removed intact within the retrieval bag via the camera port site after this incision was extended slightly. This fascial opening was then closed with two #1 PDS sutures.    All incisions were injected with local anesthetic and reapproximated at the skin with 4-0 monocryl sutures.  Dermabond was applied to the skin. The patient tolerated the procedure well and without complications and was transferred to the recovery unit in satisfactory condition.   Pryor Curia MD

## 2017-08-23 ENCOUNTER — Ambulatory Visit (HOSPITAL_COMMUNITY): Payer: Medicare Other | Admitting: Adult Health

## 2017-08-23 ENCOUNTER — Other Ambulatory Visit: Payer: Self-pay | Admitting: Cardiology

## 2017-08-23 DIAGNOSIS — I480 Paroxysmal atrial fibrillation: Secondary | ICD-10-CM

## 2017-08-23 DIAGNOSIS — R001 Bradycardia, unspecified: Secondary | ICD-10-CM

## 2017-08-23 LAB — BASIC METABOLIC PANEL
Anion gap: 7 (ref 5–15)
BUN: 42 mg/dL — AB (ref 6–20)
CALCIUM: 8.2 mg/dL — AB (ref 8.9–10.3)
CHLORIDE: 106 mmol/L (ref 101–111)
CO2: 23 mmol/L (ref 22–32)
CREATININE: 2.19 mg/dL — AB (ref 0.61–1.24)
GFR calc Af Amer: 32 mL/min — ABNORMAL LOW (ref >60)
GFR calc non Af Amer: 27 mL/min — ABNORMAL LOW (ref >60)
GLUCOSE: 148 mg/dL — AB (ref 65–99)
Potassium: 4.4 mmol/L (ref 3.5–5.1)
Sodium: 136 mmol/L (ref 135–145)

## 2017-08-23 LAB — HEMOGLOBIN AND HEMATOCRIT, BLOOD
HEMATOCRIT: 30.9 % — AB (ref 39.0–52.0)
Hemoglobin: 10 g/dL — ABNORMAL LOW (ref 13.0–17.0)

## 2017-08-23 MED ORDER — BISACODYL 10 MG RE SUPP
10.0000 mg | Freq: Once | RECTAL | Status: AC
  Administered 2017-08-23: 10 mg via RECTAL
  Filled 2017-08-23: qty 1

## 2017-08-23 MED ORDER — METOPROLOL TARTRATE 25 MG PO TABS
12.5000 mg | ORAL_TABLET | Freq: Two times a day (BID) | ORAL | Status: DC
  Administered 2017-08-24: 12.5 mg via ORAL
  Filled 2017-08-23: qty 1

## 2017-08-23 MED ORDER — ACETAMINOPHEN 325 MG PO TABS
650.0000 mg | ORAL_TABLET | ORAL | Status: DC | PRN
  Administered 2017-08-23: 650 mg via ORAL
  Filled 2017-08-23: qty 2

## 2017-08-23 NOTE — Progress Notes (Signed)
Patient ID: Vincent Pope, male   DOB: [birth year], 76 y.o.   MRN: [id number]  Pt with asymptomatic pauses.   Dr. Radford Pax contacted with recommendations as noted in her note.  Will decrease metoprolol.    Pt otherwise doing well.  PT evaluation completed.  Will SL IVF.  Plan for discharge to SNF tomorrow.

## 2017-08-23 NOTE — Clinical Social Work Note (Signed)
Clinical Social Work Assessment  Patient Details  Name: Vincent Pope MRN: [id number] Date of Birth: 1941-07-12  Date of referral:  08/23/17               Reason for consult:  Facility Placement                Permission sought to share information with:  Facility Art therapist granted to share information::  Yes, Verbal Permission Granted  Name::        Agency::     Relationship::     Contact Information:     Housing/Transportation Living arrangements for the past 2 months:  [place] of Information:  Patient Patient Interpreter Needed:  None Criminal Activity/Legal Involvement Pertinent to Current Situation/Hospitalization:  No - Comment as needed Significant Relationships:  Other Family Members Lives with:    Do you feel safe going back to the place where you live?  Yes Need for family participation in patient care:  No (Coment)  Care giving concerns:  Patient from [facility] care. PT recommending SNF.   Social Worker assessment / plan:  CSW spoke with patient at bedside regarding discharge planning. Patient reported that he has been at his current SNF for 7 months and that he likes it. Patient reported that prior to living at SNF he was staying in an apartment alone. CSW and patient discussed patient's support system, patient reported that he has a brother and niece that lives locally.   CSW will complete FL2 and contact patient's SNF to provide update.  Employment status:  Retired Nurse, adult PT Recommendations:  [place] / Referral to community resources:  [place]  Patient/Family's Response to care:  Patient agreeable to returning to current SNF. Patient appreciative of CSW assistance with discharge planning.  Patient/Family's Understanding of and Emotional Response to Diagnosis, Current Treatment, and Prognosis:  Patient verbalized  understanding of diagnosis and current treatment. Patient reported that he is waiting on test results from recent surgery to learn more about his prognosis. CSW inquired about patient was dealing with the stress of waiting on his test results, patient reported that he wasn't stressed and that he is doing okay. Patient presented pleasant throughout assessment and remained optimistic about test results. Patient verbalized plan to discharge back to current SNF.   Emotional Assessment Appearance:  Appears stated age Attitude/Demeanor/Rapport:  Other (Open) Affect (typically observed):  Pleasant Orientation:  Oriented to Self, Oriented to Place, Oriented to  Time, Oriented to Situation Alcohol / Substance use:  Not Applicable Psych involvement (Current and /or in the community):  No (Comment)  Discharge Needs  Concerns to be addressed:  No discharge needs identified Readmission within the last 30 days:  No Current discharge risk:  None Barriers to Discharge:  Continued Medical Work up   [employer], LCSW 08/23/2017, 1:37 PM

## 2017-08-23 NOTE — Progress Notes (Signed)
Called by Urology PA due to patient having asymptomatic pauses in chronic afib at night up to 2.5 seconds.  He is asymptomatic and review of tele strips these mainly occur at night.  HR during the day 55-80bpm.  Recommend decreasing metoprolol to 12.5mg  BID and we will get an outpt event monitor to assess for asymptomatic pauses further.  We will have him followup in office with Dr. Jacinta Shoe.

## 2017-08-23 NOTE — Progress Notes (Signed)
Patient had 2.32 second pause. EKG obtained. Patient asymptomatic, BP stable. Will continue to monitor.

## 2017-08-23 NOTE — NC FL2 (Signed)
[place] LEVEL OF CARE SCREENING TOOL     IDENTIFICATION  Patient Name: Vincent Pope Birthdate: 09-07-1941 Sex: male Admission Date (Current Location): 08/22/2017  [facility] and [place] Number:  Engineer, manufacturing systems and Address:  [facility],  [place] [address] [place]      Provider Number: [id number]  Attending Physician Name and Address:  Raynelle Bring, MD  Relative Name and Phone Number:       Current Level of Care: Hospital Recommended Level of Care: [place] Prior Approval Number:    Date Approved/Denied:   PASRR Number: [id number]  Discharge Plan: SNF    Current Diagnoses: Patient Active Problem List   Diagnosis Date Noted  . Neoplasm of left kidney 08/22/2017  . Abdominal aortic aneurysm (AAA) without rupture ([place]) 06/22/2017  . Atrial fibrillation ([place]) 06/22/2017  . Thyroid nodule 05/13/2017  . Right renal mass 05/04/2017    Orientation RESPIRATION BLADDER Height & Weight     Self, Time, Situation, Place  Normal Continent Weight: 275 lb (124.7 kg) Height:  6\' 1"  (185.4 cm)  BEHAVIORAL SYMPTOMS/MOOD NEUROLOGICAL BOWEL NUTRITION STATUS        Diet (see discharge summary)  AMBULATORY STATUS COMMUNICATION OF NEEDS Skin   Limited Assist Verbally Other (Comment) ( Wound/Incision(OpenorDehisced)09/10/18Non-pressurewound;VenousstasisulcerLegRight;Lower;Anterior Foam Dressing)                       Personal Care Assistance Level of Assistance  Bathing, Feeding, Dressing Bathing Assistance: Maximum assistance Feeding assistance: Independent Dressing Assistance: Maximum assistance     Functional Limitations Info             SPECIAL CARE FACTORS FREQUENCY  PT (By licensed PT), OT (By licensed OT)     PT Frequency: 5x OT Frequency: 5x            Contractures Contractures Info: Not present    Additional Factors Info  Code Status, Allergies Code Status Info: Full  Code Allergies Info: NKA           Current Medications (08/23/2017):  This is the current hospital active medication list Current Facility-Administered Medications  Medication Dose Route Frequency Provider Last Rate Last Dose  . aspirin chewable tablet 81 mg  81 mg Oral Daily Raynelle Bring, MD   81 mg at 08/23/17 0853  . Chlorhexidine Gluconate Cloth 2 % PADS 6 each  6 each Topical Q0600 Raynelle Bring, MD   6 each at 08/23/17 0507  . cholecalciferol (VITAMIN D) tablet 1,000 Units  1,000 Units Oral Daily Raynelle Bring, MD   1,000 Units at 08/23/17 [phone]  . dextrose 5 %-0.45 % sodium chloride infusion   Intravenous Continuous Raynelle Bring, MD 75 mL/hr at 08/23/17 1149    . diphenhydrAMINE (BENADRYL) injection 12.5 mg  12.5 mg Intravenous Q6H PRN Raynelle Bring, MD       Or  . diphenhydrAMINE (BENADRYL) 12.5 MG/5ML elixir 12.5 mg  12.5 mg Oral Q6H PRN Raynelle Bring, MD      . docusate sodium (COLACE) capsule 100 mg  100 mg Oral BID Raynelle Bring, MD   100 mg at 08/23/17 0853  . enoxaparin (LOVENOX) injection 40 mg  40 mg Subcutaneous Q24H Raynelle Bring, MD   40 mg at 08/23/17 0850  . feeding supplement (PRO-STAT SUGAR FREE 64) liquid 30 mL  30 mL Oral TID WC Raynelle Bring, MD   30 mL at 08/23/17 1150  . furosemide (LASIX) tablet 40 mg  40 mg  Oral Daily Raynelle Bring, MD   40 mg at 08/23/17 0855  . lisinopril (PRINIVIL,ZESTRIL) tablet 20 mg  20 mg Oral Daily Raynelle Bring, MD   20 mg at 08/22/17 1800  . metoprolol tartrate (LOPRESSOR) tablet 12.5 mg  12.5 mg Oral BID Dancy, Amanda, PA-C      . mupirocin ointment (BACTROBAN) 2 % 1 application  1 application Nasal BID Raynelle Bring, MD   1 application at 91/47/82 0855  . ondansetron (ZOFRAN) injection 4 mg  4 mg Intravenous Q4H PRN Raynelle Bring, MD      . pantoprazole (PROTONIX) EC tablet 40 mg  40 mg Oral Daily Raynelle Bring, MD   40 mg at 08/23/17 0855  . tamsulosin (FLOMAX) capsule 0.4 mg  0.4 mg Oral Daily Raynelle Bring, MD    0.4 mg at 08/23/17 9562     Discharge Medications: Please see discharge summary for a list of discharge medications.  Relevant Imaging Results:  Relevant Lab Results:   Additional Information SSN [id number]  Burnis Medin, LCSW

## 2017-08-23 NOTE — Evaluation (Signed)
Physical Therapy Evaluation Patient Details Name: Vincent Pope MRN: [id number] DOB: June 13, 1941 Today's Date: 08/23/2017   History of Present Illness  76 yo male admitted with neoplasm of L kidney. S/P R lap radical nephrectomy 08/22/17, a fib.   Clinical Impression  On eval, pt required Min assist for mobility. He walked ~60 feet with a RW. Pt presents with general weakness, decreased activity tolerance, and impaired gait and balance. Discussed d/c plan-pt plans to return to [facility] for continued rehab. Will follow during hospital stay.     Follow Up Recommendations SNF    Equipment Recommendations  None recommended by PT    Recommendations for Other Services       Precautions / Restrictions Precautions Precautions: Fall Restrictions Weight Bearing Restrictions: No      Mobility  Bed Mobility               General bed mobility comments: oob in reclilner  Transfers Overall transfer level: Needs assistance Equipment used: Rolling walker (2 wheeled) Transfers: Sit to/from Stand Sit to Stand: Min assist         General transfer comment: Assist to rise, stabilize, control descent. VCs hand placement  Ambulation/Gait Ambulation/Gait assistance: Min assist Ambulation Distance (Feet): 60 Feet Assistive device: Rolling walker (2 wheeled) Gait Pattern/deviations: Step-through pattern;Decreased stride length     General Gait Details: Assist to stabilize. Pt fatigues fairly easily.   Stairs            Wheelchair Mobility    Modified Rankin (Stroke Patients Only)       Balance Overall balance assessment: Needs assistance         Standing balance support: Bilateral upper extremity supported Standing balance-Leahy Scale: Poor Standing balance comment: uses RW                             Pertinent Vitals/Pain Pain Assessment: Faces Faces Pain Scale: Hurts even more Pain Location: abdomen post op Pain Descriptors / Indicators:  Sore Pain Intervention(s): Monitored during session    Home Living Family/patient expects to be discharged to:: Skilled nursing facility                      Prior Function Level of Independence: Needs assistance   Gait / Transfers Assistance Needed: working with PT at SNF           Hand Dominance        Extremity/Trunk Assessment   Upper Extremity Assessment Upper Extremity Assessment: Generalized weakness    Lower Extremity Assessment Lower Extremity Assessment: Generalized weakness    Cervical / Trunk Assessment Cervical / Trunk Assessment: Normal  Communication   Communication: HOH  Cognition Arousal/Alertness: Awake/alert Behavior During Therapy: WFL for tasks assessed/performed Overall Cognitive Status: Within Functional Limits for tasks assessed                                        General Comments      Exercises     Assessment/Plan    PT Assessment Patient needs continued PT services  PT Problem List Decreased strength;Decreased mobility;Pain;Decreased activity tolerance;Decreased balance;Decreased knowledge of use of DME       PT Treatment Interventions DME instruction;Gait training;Therapeutic activities;Therapeutic exercise;Patient/family education;Balance training;Functional mobility training    PT Goals (Current goals can be found in the Care Plan section)  Acute Rehab PT Goals Patient Stated Goal: regain PLOF PT Goal Formulation: With patient Time For Goal Achievement: 09/06/17 Potential to Achieve Goals: Good    Frequency Min 3X/week   Barriers to discharge        Co-evaluation               AM-PAC PT "6 Clicks" Daily Activity  Outcome Measure Difficulty turning over in bed (including adjusting bedclothes, sheets and blankets)?: Unable Difficulty moving from lying on back to sitting on the side of the bed? : Unable Difficulty sitting down on and standing up from a chair with arms (e.g.,  wheelchair, bedside commode, etc,.)?: Unable Help needed moving to and from a bed to chair (including a wheelchair)?: A Lot Help needed walking in hospital room?: A Little Help needed climbing 3-5 steps with a railing? : A Lot 6 Click Score: 10    End of Session Equipment Utilized During Treatment: Gait belt Activity Tolerance: Patient limited by fatigue Patient left: in chair;with call bell/phone within reach;with chair alarm set   PT Visit Diagnosis: Muscle weakness (generalized) (M62.81);Difficulty in walking, not elsewhere classified (R26.2)    Time: 4585-9292 PT Time Calculation (min) (ACUTE ONLY): 9 min   Charges:   PT Evaluation $PT Eval Low Complexity: 1 Low     PT G Codes:          Weston Anna, MPT Pager: [phone]

## 2017-08-23 NOTE — Progress Notes (Signed)
Patient ID: Vincent Pope, male   DOB: [birth year], 76 y.o.   MRN: [id number]  1 Day Post-Op Subjective: Pt doing well.  Ambulated with walker in halls last night.  Passing flatus.  Pain well controlled. Noted to have a couple 2-3 sec pauses last night.  ECG performed and indicates atrial fibrillation with bradycardia (this is consistent with his baseline).  He has been asymptomatic with stable hemodynamics.  Objective: Vital signs in last 24 hours: Temp:  [97.4 F (36.3 C)-99.2 F (37.3 C)] 98 F (36.7 C) (09/11 0456) Pulse Rate:  [46-71] 50 (09/11 0511) Resp:  [14-20] 20 (09/11 0456) BP: (109-141)/(47-78) 122/53 (09/11 0511) SpO2:  [96 %-100 %] 96 % (09/11 0456) Weight:  [124.7 kg (275 lb)-124.9 kg (275 lb 6.4 oz)] 124.7 kg (275 lb) (09/10 1800)  Intake/Output from previous day: 09/10 0701 - 09/11 0700 In: 4312.5 [P.O.:300; I.V.:3612.5; IV Piggyback:400] Out: 1560 [Urine:1410; Blood:150] Intake/Output this shift: No intake/output data recorded.  Physical Exam:  General: Alert and oriented CV: Irregular Lungs: Clear Abdomen: Soft, ND, positive BS Incisions: C/D/I Ext: NT, No erythema  Lab Results:  Recent Labs  08/22/17 0948 08/22/17 1443 08/23/17 0432  HGB 12.3* 11.4* 10.0*  HCT 36.9* 35.5* 30.9*   BMET  Recent Labs  08/22/17 1443 08/23/17 0432  NA 139 136  K 4.4 4.4  CL 108 106  CO2 24 23  GLUCOSE 126* 148*  BUN 43* 42*  CREATININE 1.93* 2.19*  CALCIUM 8.7* 8.2*     Studies/Results: No results found.  Assessment/Plan: POD # 1 s/p right lap nephrectomy - Path pending - Continue to ambulate with assistance, PT consult today for recommendations upon discharge (pt has been working with PT at [facility]) - Advance diet - D/C [place] - Monitor renal function - Dulcolax suppository - Will monitor bradycardia.  If he has another significant pause, will discuss with cardiology to see if medications need adjustment (pt does not have pacemaker) - Likely  plan for discharge tomorrow back to [facility].  Will notify Case Management.   LOS: 1 day   Vincent Pope,LES 08/23/2017, 7:43 AM

## 2017-08-23 NOTE — Consult Note (Signed)
[facility] Nurse wound consult note Reason for Consult: partial and full thickness wounds on left anterior foot Wound type:Venous insufficiency, trauma Pressure Injury POA: N/A Measurement: 4 partial and full thickness wounds in an 8cm x 7cm area, the largest of which measures 2cm x 3.5cm x 0.1cm. Wound ZMC:EYEM, moist Drainage (amount, consistency, odor) scant to small amount of serous exudate Periwound:mild erythema, edema Dressing procedure/placement/frequency: I will implement a conservative POC top include topical wound care using xeroform gauze twice daily and securing that with Kerlix roll gauze wrapped from toe to knee topped with ACE bandage to promote venous return gently.  Elevation via Prevalon Boots bilaterally will be a part of the POC. [place] nursing team will not follow, but will remain available to this patient, the nursing and medical teams.  Please re-consult if needed. Thanks, Maudie Flakes, MSN, RN, [place], Arther Abbott  Pager# [phone]

## 2017-08-23 NOTE — Progress Notes (Addendum)
Called MD office and left message for MD with Jenny Reichmann RE:  reported 2.47 sec pause, PA on unit and was also made aware

## 2017-08-24 LAB — BASIC METABOLIC PANEL
ANION GAP: 7 (ref 5–15)
BUN: 39 mg/dL — ABNORMAL HIGH (ref 6–20)
CO2: 27 mmol/L (ref 22–32)
CREATININE: 2.23 mg/dL — AB (ref 0.61–1.24)
Calcium: 8.5 mg/dL — ABNORMAL LOW (ref 8.9–10.3)
Chloride: 101 mmol/L (ref 101–111)
GFR calc non Af Amer: 27 mL/min — ABNORMAL LOW (ref >60)
GFR, EST AFRICAN AMERICAN: 31 mL/min — AB (ref >60)
Glucose, Bld: 99 mg/dL (ref 65–99)
Potassium: 4.1 mmol/L (ref 3.5–5.1)
Sodium: 135 mmol/L (ref 135–145)

## 2017-08-24 LAB — HEMOGLOBIN AND HEMATOCRIT, BLOOD
HCT: 34 % — ABNORMAL LOW (ref 39.0–52.0)
Hemoglobin: 11.1 g/dL — ABNORMAL LOW (ref 13.0–17.0)

## 2017-08-24 MED ORDER — METOPROLOL TARTRATE 25 MG PO TABS: 12.5000 mg | ORAL_TABLET | Freq: Two times a day (BID) | ORAL | 0 refills | Status: DC

## 2017-08-24 NOTE — Progress Notes (Signed)
Patient ID: Vincent Pope, male   DOB: [birth year], 76 y.o.   MRN: [id number]  2 Days Post-Op Subjective: Pt doing well overnight.  No complaints.  Tolerating diet.  Ambulating with walker.  Pain controlled.  He had BM.  Objective: Vital signs in last 24 hours: Temp:  [98 F (36.7 C)-98.3 F (36.8 C)] 98 F (36.7 C) (09/12 0523) Pulse Rate:  [52-77] 65 (09/12 0523) Resp:  [18-20] 18 (09/12 0523) BP: (122-140)/(49-62) 140/62 (09/12 0523) SpO2:  [95 %-97 %] 97 % (09/12 0523)  Intake/Output from previous day: 09/11 0701 - 09/12 0700 In: 1256.3 [P.O.:480; I.V.:776.3] Out: 3754 [Urine:3750; Stool:4] Intake/Output this shift: No intake/output data recorded.  Physical Exam:  General: Alert and oriented CV: Irregularly, irregular Lungs: Clear Abdomen: Soft, ND, Positive BS. Incisions: C/D/I  Lab Results:  Recent Labs  08/22/17 1443 08/23/17 0432 08/24/17 0632  HGB 11.4* 10.0* 11.1*  HCT 35.5* 30.9* 34.0*   BMET  Recent Labs  08/23/17 0432 08/24/17 0632  NA 136 135  K 4.4 4.1  CL 106 101  CO2 23 27  GLUCOSE 148* 99  BUN 42* 39*  CREATININE 2.19* 2.23*  CALCIUM 8.2* 8.5*     Studies/Results: Path pending  Assessment/Plan: POD # 2 s/p right lap nephrectomy - Path pending - Renal function stable - D/C to [facility] today and continue physical therapy at [facility] - Dr. Harlow Asa to proceed with further evaluation of thyroid mass as an outpatient    LOS: 2 days   Serge Main,LES 08/24/2017, 7:50 AM

## 2017-08-24 NOTE — Discharge Summary (Addendum)
Date of admission: 08/22/2017  Date of discharge: 08/24/2017  Admission diagnosis: Right renal neoplasm  Discharge diagnosis: Right renal neoplasm  Secondary diagnoses: Hypertension, atrial fibrillation  History and Physical: For full details, please see admission history and physical. Briefly, Vincent Pope is a 76 y.o. year old patient with an enlarging right renal neoplasm.   Hospital Course: He underwent a right laparoscopic radical nephrectomy on 08/22/17.  He was transferred to a telemetry bed.  He did have a few asymptomatic episodes of bradycardia with pauses of 2-3 seconds.  Cardiology was contacted and recommended reducing his dose of metoprolol to 12.5 mg.  He will be contacted to arrange outpatient evaluation and cardiac monitoring for further assessment per Dr. Radford Pax.  His postoperative course was otherwise unremarkable.  He was able to resume his diet and had return of bowel function.  He ambulated with a walker and was seen by physical therapy during his hospitalization and will resume PT at his SNF as before surgery.  His renal function and hemoglobin remained stable.  Laboratory values:  Recent Labs  08/22/17 1443 08/23/17 0432 08/24/17 0632  HGB 11.4* 10.0* 11.1*  HCT 35.5* 30.9* 34.0*    Recent Labs  08/23/17 0432 08/24/17 0632  CREATININE 2.19* 2.23*    Disposition:  SNF  Discharge instruction: The patient was instructed to be ambulatory but told to refrain from heavy lifting, strenuous activity, or driving.   Discharge medications:  Allergies as of 08/24/2017   No Known Allergies     Medication List    STOP taking these medications   aspirin 81 MG chewable tablet   Vitamin D3 1000 units Caps     TAKE these medications   feeding supplement (PRO-STAT SUGAR FREE 64) Liqd Take 30 mLs by mouth 3 (three) times daily with meals.   furosemide 40 MG tablet Commonly known as:  LASIX Take 40 mg by mouth daily.   HYDROcodone-acetaminophen 5-325 MG  tablet Commonly known as:  NORCO Take 1-2 tablets by mouth every 6 (six) hours as needed for moderate pain or severe pain.   lisinopril 20 MG tablet Commonly known as:  PRINIVIL,ZESTRIL Take 20 mg by mouth daily.   metoprolol tartrate 12.5 MG tablet Commonly known as:  LOPRESSOR Take 12.5 mg by mouth 2 (two) times daily.   pantoprazole 40 MG tablet Commonly known as:  PROTONIX Take 40 mg by mouth daily.   polyethylene glycol packet Commonly known as:  MIRALAX / GLYCOLAX Take 17 g by mouth daily.   tamsulosin 0.4 MG Caps capsule Commonly known as:  FLOMAX Take 0.4 mg by mouth daily.            Discharge Care Instructions        Start     Ordered   08/22/17 0000  HYDROcodone-acetaminophen (NORCO) 5-325 MG tablet  Every 6 hours PRN    Question:  Supervising Provider  Answer:  Raynelle Bring   08/22/17 1130      Followup:  Follow-up Information    Raynelle Bring, MD Follow up on 09/14/2017.   Specialty:  Urology Why:  at 12:30 Contact information: [place] [address] [phone]        Herminio Commons, MD Follow up.   Specialty:  Cardiology Why:  our office will call you with a hospital follow-up appointment with cardiology as well as arrange appt time to pick up heart monitor.  Contact information: [address] [place] [address] [phone]  PLEASE NOTE MEDICATION DOSE CHANGE OF METOPROLOL PER CARDIOLOGY!

## 2017-09-08 ENCOUNTER — Ambulatory Visit: Payer: Medicare Other | Admitting: Adult Health

## 2017-09-08 NOTE — Progress Notes (Deleted)
Cardiology Office Note   Date:  09/08/2017   ID:  [name], DOB 1941/01/15, MRN [id number]  PCP:  Hilbert Corrigan, MD  Cardiologist: Kate Sable  No chief complaint on file.     History of Present Illness: Vincent Pope is a 76 y.o. male who presents for posthospitalization follow-up, after admission for right renal neoplasm with history of hypertension and atrial fibrillation. Postoperatively the patient had a few asymptomatic episodes of bradycardia and pauses up to 2-3 seconds. The patient's metoprolol was decreased to 12.5 mg daily. He is here for posthospitalization evaluation of heart rate and his response to medication adjustments    Past Medical History:  Diagnosis Date  . Abdominal aortic ectasia ([place])   . Atherosclerosis of aorta ([place])   . Atherosclerosis of aorta ([place])   . Atrial fibrillation ([place])   . Benign prostatic hyperplasia   . Cancer [facility])    malignant neoplasm of right kidney per records from [facility]  . Cognitive communication deficit   . Congestive heart failure (CHF) ([place])   . Diverticulitis   . Dysrhythmia    Atrial Fibrillation per records from [facility]  . Hypertension, essential   . Neoplasm of right kidney   . Pneumonia    from records from [facility]  . Thrombocytopenia ([place])   . Vitamin D deficiency     Past Surgical History:  Procedure Laterality Date  . CATARACT EXTRACTION Bilateral   . LAPAROSCOPIC NEPHRECTOMY Right 08/22/2017   Procedure: LAPAROSCOPIC RADICAL NEPHRECTOMY;  Surgeon: Raynelle Bring, MD;  Location: [facility] ORS;  Service: Urology;  Laterality: Right;     Current Outpatient Prescriptions  Medication Sig Dispense Refill  . Amino Acids-Protein Hydrolys (FEEDING SUPPLEMENT, PRO-STAT SUGAR FREE 64,) LIQD Take 30 mLs by mouth 3 (three) times daily with meals.    . furosemide (LASIX) 40 MG tablet Take 40 mg by mouth daily.    Marland Kitchen HYDROcodone-acetaminophen  (NORCO) 5-325 MG tablet Take 1-2 tablets by mouth every 6 (six) hours as needed for moderate pain or severe pain. 30 tablet 0  . lisinopril (PRINIVIL,ZESTRIL) 20 MG tablet Take 20 mg by mouth daily.    . metoprolol tartrate (LOPRESSOR) 25 MG tablet Take 0.5 tablets (12.5 mg total) by mouth 2 (two) times daily. 60 tablet 0  . pantoprazole (PROTONIX) 40 MG tablet Take 40 mg by mouth daily.    . polyethylene glycol (MIRALAX / GLYCOLAX) packet Take 17 g by mouth daily.    . tamsulosin (FLOMAX) 0.4 MG CAPS capsule Take 0.4 mg by mouth daily.     No current facility-administered medications for this visit.     Allergies:   Patient has no known allergies.    Social History:  The patient  reports that he has quit smoking. He has never used smokeless tobacco. He reports that he does not drink alcohol or use drugs.   Family History:  The patient's family history is not on file.    ROS: All other systems are reviewed and negative. Unless otherwise mentioned in H&P    PHYSICAL EXAM: VS:  There were no vitals taken for this visit. , BMI There is no height or weight on file to calculate BMI. GEN: Well nourished, well developed, in no acute distress HEENT: normal Neck: no JVD, carotid bruits, or masses Cardiac: ***RRR; no murmurs, rubs, or gallops,no edema  Respiratory:  clear to auscultation bilaterally, normal work of breathing GI: soft, nontender, nondistended, + BS MS:  no deformity or atrophy Skin: warm and dry, no rash Neuro:  Strength and sensation are intact Psych: euthymic mood, full affect   EKG:  EKG {ACTION; IS/IS OIZ:[id number]} ordered today. The ekg ordered today demonstrates ***   Recent Labs: 05/04/2017: ALT 13 08/22/2017: Platelets 209 08/24/2017: BUN 39; Creatinine, Ser 2.23; Hemoglobin 11.1; Potassium 4.1; Sodium 135    Lipid Panel No results found for: CHOL, TRIG, HDL, CHOLHDL, VLDL, LDLCALC, LDLDIRECT    Wt Readings from Last 3 Encounters:  08/22/17 275 lb (124.7  kg)  06/22/17 288 lb (130.6 kg)  05/13/17 284 lb 11.2 oz (129.1 kg)      Other studies Reviewed: Additional studies/ records that were reviewed today include: ***. Review of the above records demonstrates: ***   ASSESSMENT AND PLAN:  1.  ***   Current medicines are reviewed at length with the patient today.    Labs/ tests ordered today include: *** Phill Myron. [name], ANP, [facility]   09/08/2017 6:48 AM    [facility] [address] [place], [place] [address] Phone: [phone]; Fax: [phone]

## 2017-09-11 NOTE — Progress Notes (Signed)
Cardiology Office Note   Date:  09/12/2017   ID:  Vincent Pope, DOB 1941-07-03, MRN [id number]  PCP:  Vincent Corrigan, MD  Cardiologist:  Vincent Sable MD   Chief Complaint  Patient presents with  . Atrial Fibrillation     History of Present Illness: Vincent Pope is a 76 y.o. male who presents for ongoing assessment and management of atrial fibrillation, and was last seen in the office on 06/22/2017 for preoperative cardiac evaluation. He was scheduled for a [employer]. No medications were changed, he was not anticoagulated due to hematuria.   Stress Test Study Result    Atrial fibrillation seen throughout study.  Defect 1: There is a medium defect of moderate severity present in the basal inferoseptal, basal inferior, mid inferoseptal, mid inferior and apical inferior location.  Findings consistent with prior myocardial infarction with peri-infarct ischemia with additional variable soft tissue attenuation artifact.  This is an intermediate risk study.   Post operatively, he had episodes of asymptomatic pauses up to 2.5 seconds. Metoprolol was decreased to 12.5 mg BID and an OP cardiac event monitor was planned. This has not yet been placed.  He was disharged on 08/24/2017 with diagnosis of right renal neoplasm.   He has felt better with medication changes. He continues with PT at [facility] and feels well, without associated chest pain, dizziness or dyspnea.   Past Medical History:  Diagnosis Date  . 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 Atherosclerosis of aorta ([place])   . Atherosclerosis of aorta ([place])   . Atrial fibrillation ([place])   . Benign prostatic hyperplasia   . Cancer [facility])    malignant neoplasm of right kidney per records from [facility]  . Cognitive communication deficit   . Congestive heart failure (CHF) ([place])   . Diverticulitis   . Dysrhythmia    Atrial Fibrillation per records from [facility]  .  Hypertension, essential   . Neoplasm of right kidney   . Pneumonia    from records from [facility]  . Thrombocytopenia ([place])   . Vitamin D deficiency     Past Surgical History:  Procedure Laterality Date  . CATARACT EXTRACTION Bilateral   . LAPAROSCOPIC NEPHRECTOMY Right 08/22/2017   Procedure: LAPAROSCOPIC RADICAL NEPHRECTOMY;  Surgeon: Vincent Bring, MD;  Location: [facility];  Service: Urology;  Laterality: Right;     Current Outpatient Prescriptions  Medication Sig Dispense Refill  . Amino Acids-Protein Hydrolys (FEEDING SUPPLEMENT, PRO-STAT SUGAR FREE 64,) LIQD Take 30 mLs by mouth 3 (three) times daily with meals.    . furosemide (LASIX) 40 MG tablet Take 40 mg by mouth daily.    Marland Kitchen HYDROcodone-acetaminophen (NORCO) 5-325 MG tablet Take 1-2 tablets by mouth every 6 (six) hours as needed for moderate pain or severe pain. 30 tablet 0  . lisinopril (PRINIVIL,ZESTRIL) 20 MG tablet Take 20 mg by mouth daily.    . metoprolol tartrate (LOPRESSOR) 25 MG tablet Take 0.5 tablets (12.5 mg total) by mouth 2 (two) times daily. 60 tablet 0  . pantoprazole (PROTONIX) 40 MG tablet Take 40 mg by mouth daily.    . polyethylene glycol (MIRALAX / GLYCOLAX) packet Take 17 g by mouth daily.    . tamsulosin (FLOMAX) 0.4 MG CAPS capsule Take 0.4 mg by mouth daily.     No current facility-administered medications for this visit.     Allergies:   Patient has no known allergies.    Social History:  The patient  reports that he has quit smoking. He has never used smokeless tobacco. He reports that he does not drink alcohol or use drugs.   Family History:  The patient's family history is not on file.    ROS: All other systems are reviewed and negative. Unless otherwise mentioned in H&P    PHYSICAL EXAM: VS:  BP (!) 100/58   Pulse (!) 55   Ht 6\' 1"  (1.854 m)   Wt 270 lb (122.5 kg)   SpO2 97%   BMI 35.62 kg/m  , BMI Body mass index is 35.62 kg/m. GEN: Well nourished, well  developed, in no acute distress Sitting in a wheel chair.  HEENT: normal  Neck: no JVD, carotid bruits, or masses Cardiac: RRR; no murmurs, rubs, or gallops,no edema  Respiratory:  clear to auscultation bilaterally, normal work of breathing GI: soft, nontender, nondistended, + BS MS: no deformity or atrophy  Skin: warm and dry, no rash Neuro:  Strength and sensation are intact Psych: euthymic mood, full affect   Recent Labs: 05/04/2017: ALT 13 08/22/2017: Platelets 209 08/24/2017: BUN 39; Creatinine, Ser 2.23; Hemoglobin 11.1; Potassium 4.1; Sodium 135    Lipid Panel No results found for: CHOL, TRIG, HDL, CHOLHDL, VLDL, LDLCALC, LDLDIRECT    Wt Readings from Last 3 Encounters:  09/12/17 270 lb (122.5 kg)  08/22/17 275 lb (124.7 kg)  06/22/17 288 lb (130.6 kg)      ASSESSMENT AND PLAN:  1. CAD: No cardiac complaints. Doing well on lower dose of metoprolol to 12.5 mg BID BP is soft currently. I have asked that BP be completed during PT to ascertain orthostatic BP with position change. May need to decrease lisinopril to 10 mg daily from 20 mg daily.   2. HTN: BP is soft as noted. No evidence of volume overload currently. Will consider decreasing lasix to 20 mg daily   3. Anemia: Anemia profile to be ordered by SNF.    Current medicines are reviewed at length with the patient today.    Labs/ tests ordered today include:  Vincent Pope, Vincent Pope, Vincent Pope   09/12/2017 2:09 PM    [place] [address], [place], [place] [address] Phone: [phone]; Fax: [phone]

## 2017-09-12 ENCOUNTER — Ambulatory Visit (INDEPENDENT_AMBULATORY_CARE_PROVIDER_SITE_OTHER): Payer: Medicare Other | Admitting: Adult Health

## 2017-09-12 ENCOUNTER — Encounter: Payer: Self-pay | Admitting: Adult Health

## 2017-09-12 VITALS — BP 100/58 | HR 55 | Ht 73.0 in | Wt 270.0 lb

## 2017-09-12 DIAGNOSIS — I2510 Atherosclerotic heart disease of native coronary artery without angina pectoris: Secondary | ICD-10-CM | POA: Diagnosis not present

## 2017-09-12 DIAGNOSIS — I5032 Chronic diastolic (congestive) heart failure: Secondary | ICD-10-CM | POA: Diagnosis not present

## 2017-09-12 DIAGNOSIS — I480 Paroxysmal atrial fibrillation: Secondary | ICD-10-CM

## 2017-09-12 NOTE — Patient Instructions (Signed)
Medication Instructions:  Your physician recommends that you continue on your current medications as directed. Please refer to the Current Medication list given to you today.   Labwork: NONE   Testing/Procedures: NONE   Follow-Up: Your physician wants you to follow-up in: 6 Months. You will receive a reminder letter in the mail two months in advance. If you don't receive a letter, please call our office to schedule the follow-up appointment.   Any Other Special Instructions Will Be Listed Below (If Applicable).     If you need a refill on your cardiac medications before your next appointment, please call your pharmacy.  Thank you for choosing [facility]!   

## 2017-09-14 ENCOUNTER — Telehealth: Payer: Self-pay | Admitting: Adult Health

## 2017-09-14 NOTE — Telephone Encounter (Signed)
Spoke with Pt nurse who states that pt is not currently wearing monitor. Nurse will have AD call [employer] in the morning.

## 2017-09-17 ENCOUNTER — Encounter (INDEPENDENT_AMBULATORY_CARE_PROVIDER_SITE_OTHER): Payer: Medicare Other

## 2017-09-17 DIAGNOSIS — I480 Paroxysmal atrial fibrillation: Secondary | ICD-10-CM | POA: Diagnosis not present

## 2017-09-17 DIAGNOSIS — R001 Bradycardia, unspecified: Secondary | ICD-10-CM | POA: Diagnosis not present

## 2017-09-27 ENCOUNTER — Ambulatory Visit: Payer: Medicare Other | Admitting: Cardiovascular Disease

## 2017-09-28 ENCOUNTER — Encounter (HOSPITAL_COMMUNITY): Payer: Medicare Other | Attending: Adult Health | Admitting: Adult Health

## 2017-09-28 ENCOUNTER — Encounter (HOSPITAL_COMMUNITY): Payer: Self-pay | Admitting: Adult Health

## 2017-09-28 VITALS — BP 121/51 | HR 50 | Temp 98.2°F | Resp 18 | Wt 271.3 lb

## 2017-09-28 DIAGNOSIS — C641 Malignant neoplasm of right kidney, except renal pelvis: Secondary | ICD-10-CM | POA: Diagnosis not present

## 2017-09-28 DIAGNOSIS — R609 Edema, unspecified: Secondary | ICD-10-CM

## 2017-09-28 DIAGNOSIS — D649 Anemia, unspecified: Secondary | ICD-10-CM | POA: Diagnosis not present

## 2017-09-28 DIAGNOSIS — N189 Chronic kidney disease, unspecified: Secondary | ICD-10-CM | POA: Diagnosis not present

## 2017-09-28 DIAGNOSIS — R7989 Other specified abnormal findings of blood chemistry: Secondary | ICD-10-CM

## 2017-09-28 NOTE — Progress Notes (Signed)
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 note on 06/20/17)  [name] 76 y.o. male is here because of right renal mass suspicious for renal cell carcinoma. He is currently staying at [facility] and rehabilitation.  His most recent scans demonstrate:  CT abdomen and pelvis with contrast 01/09/17 demonstrated a 8.6 x 6.4 cm complex solid/cystic mass within the superior pole of the right kidney, previously 6.7 x 5.2 cm. Smaller bilateral low attenuation renal lesions are stable when compared to prior exam. Cysts within the left hepatic lobe is increased in size measuring up to 7.4 cm, previously 5.5 cm.  Chest x-ray 01/09/17 demonstrated a low lung volumes with basilar opacities primary and to represent atelectasis.  MRI abdomen with and without contrast 01/10/17 which demonstrated a heterogeneously enhancing mass in the right kidney, 6.2 x 8.7 x 6 cm, the lesion extends up to the renal vein but does not appear to extend into the renal vein at this time. This lesion remains in capsulated with [name], no lymphadenopathy. He also has multiple small renal cysts as well as numerous liver cysts as well.  Patient had gone for surgical evaluation by [name] back on 02/01/17 and was deemed not to be a surgical candidate due to his weakened state. Patient had just arrived at the skilled nursing facility for rehabilitation in February 2018. At that time he could not stand up or get out of his wheelchair and spent most of his time in bed. Per [name] know at that time, patient had abdominal CT dating back  to April 2011 which revealed a 5.4 cm indeterminate cystic focus. Follow-up CT scan in October 2015 revealed a complex cystic mass measuring 6.5 x 5.2 x 4.5 cm in the right upper pole. 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  Currently patient states that his performance status is a lot better. With physical therapy he is now ambulatory. He denies any recent hematuria. He denies any chest pain, shortness breath, abdominal pain, focal weakness. He denies any weight loss, appetite is good.  CT C/A/P 05/05/17 demonstrated stable upper pole right renal mass with imaging features suspicious for renal cell carcinoma, measuring 8.8 x 6.3 cm in the axial plane today. No evidence of metastatic disease in the chest, abdomen or pelvis. LDH was WNL.      INTERVAL HISTORY:  Mr. Curto 76 y.o. male returns for routine follow-up for right renal cell carcinoma.   Chart reviewed; he underwent (R) radical nephrectomy with [name] on 08/22/17. Pathology revealed clear cell renal cell carcinoma, nuclear grade 3, tumor measuring 8.2 cm and was confined to the kidney; surgical margins negative for carcinoma.    Overall, he tells me that he has been feeling very well since his surgery and last visit to the cancer center. Appetite 100%; energy levels 75%.  States that he has seen [name] "not too long ago"; he is not sure when he is due to see him again.  Denies any pain, shortness of breath, focal bone pain, headaches, dizziness, changes in his bowels, hematuria, or pain with  urination.  Endorses easy bleeding/bruising, but this has been chronic.  He also has chronic LE edema "and I get some ulcers on my legs every once in awhile."   He reports doing regular exercise/rehab at his facility where he is currently living.   He tells me that he is aware that his kidney function has been elevated in the past; he has never been referred to a nephrologist in the past to his knowledge.   He is here  today with one of his caregivers from SNF.     REVIEW OF SYSTEMS:  Review of Systems  Constitutional: Positive for fatigue (sometimes ). Negative for chills and fever.  HENT:  Negative.  Negative for lump/mass and nosebleeds.   Eyes: Negative.   Respiratory: Negative.  Negative for cough and shortness of breath.   Cardiovascular: Negative.  Negative for chest pain and leg swelling.  Gastrointestinal: Negative.  Negative for abdominal pain, blood in stool, constipation, diarrhea, nausea and vomiting.  Endocrine: Negative.   Genitourinary: Negative.  Negative for dysuria and hematuria.   Musculoskeletal: Negative.  Negative for arthralgias.  Skin: Negative.  Negative for rash.  Neurological: Negative.  Negative for dizziness and headaches.  Hematological: Negative for adenopathy. Bruises/bleeds easily.  Psychiatric/Behavioral: Negative.  Negative for depression and sleep disturbance. The patient is not nervous/anxious.      PAST MEDICAL/SURGICAL HISTORY:  Past Medical History:  Diagnosis Date  . 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 Atherosclerosis of aorta ([place])   . Atherosclerosis of aorta ([place])   . Atrial fibrillation ([place])   . Benign prostatic hyperplasia   . Cancer [facility])    malignant neoplasm of right kidney per records from [facility]  . Cognitive communication deficit   . Congestive heart failure (CHF) ([place])   . Diverticulitis   . Dysrhythmia    Atrial Fibrillation per records from [facility]  . Hypertension, essential   . Neoplasm of right kidney   . Pneumonia    from records from [facility]  . Thrombocytopenia ([place])   . Vitamin D deficiency    Past Surgical History:  Procedure Laterality Date  . CATARACT EXTRACTION Bilateral   . LAPAROSCOPIC NEPHRECTOMY Right 08/22/2017   Procedure: LAPAROSCOPIC RADICAL NEPHRECTOMY;  Surgeon: Raynelle Bring, MD;  Location: [facility];  Service: Urology;  Laterality: Right;      SOCIAL HISTORY:  Social History   Social History  . Marital status: Single    Spouse name: N/A  . Number of children: N/A  . Years of education: N/A   Occupational History  . Not on file.   Social History Main Topics  . Smoking status: Former Research scientist (life sciences)  . Smokeless tobacco: Never Used  . Alcohol use No  . Drug use: No  . Sexual activity: Not Currently   Other Topics Concern  . Not on file   Social History Narrative  . No narrative on file    FAMILY HISTORY:  History reviewed. No pertinent family history.  CURRENT MEDICATIONS:  Outpatient Encounter Prescriptions as of 09/28/2017  Medication Sig  . Amino Acids-Protein Hydrolys (FEEDING SUPPLEMENT, PRO-STAT SUGAR FREE 64,) LIQD Take 30 mLs by mouth 3 (three) times daily with meals.  . furosemide (LASIX) 40 MG tablet Take 40 mg by mouth daily.  Marland Kitchen HYDROcodone-acetaminophen (NORCO) 5-325 MG tablet Take 1-2 tablets by mouth every 6 (six) hours as needed for moderate pain or severe pain.  Marland Kitchen lisinopril (PRINIVIL,ZESTRIL) 20 MG tablet Take 20  mg by mouth daily.  . metoprolol tartrate (LOPRESSOR) 25 MG tablet Take 0.5 tablets (12.5 mg total) by mouth 2 (two) times daily.  . pantoprazole (PROTONIX) 40 MG tablet Take 40 mg by mouth daily.  . polyethylene glycol (MIRALAX / GLYCOLAX) packet Take 17 g by mouth daily.  . tamsulosin (FLOMAX) 0.4 MG CAPS capsule Take 0.4 mg by mouth daily.   No facility-administered encounter medications on file as of 09/28/2017.     ALLERGIES:  No Known Allergies   PHYSICAL EXAM:  ECOG Performance status: 2 - Symptomatic; requires assistance (lives in assisted living/SNF)  Vitals:   09/28/17 0944  BP: (!) 121/51  Pulse: (!) 50  Resp: 18  Temp: 98.2 F (36.8 C)  SpO2: 98%   Filed Weights   09/28/17 0944  Weight: 271 lb 4.8 oz (123.1 kg)    Physical Exam  Constitutional: He is oriented to person, place, and time.  -Chronically-ill appearing male in no acute distress  -Exam done  with patient seated in wheelchair   HENT:  Head: Normocephalic.  Mouth/Throat: Oropharynx is clear and moist. No oropharyngeal exudate.  Eyes: Pupils are equal, round, and reactive to light. Conjunctivae are normal. No scleral icterus.  Neck: Normal range of motion. Neck supple.  Cardiovascular: Regular rhythm.   Bradycardia   Pulmonary/Chest: Effort normal and breath sounds normal. No respiratory distress. He has no wheezes.  Abdominal: Soft. Bowel sounds are normal. There is no tenderness.  Musculoskeletal: Normal range of motion. He exhibits edema (2+ BLE edema).  Lymphadenopathy:    He has no cervical adenopathy.       Right: No supraclavicular adenopathy present.       Left: No supraclavicular adenopathy present.  Neurological: He is alert and oriented to person, place, and time.  Skin: Skin is warm and dry.  Clean dressing noted to (L) lower leg; patient states SNF is treating an ulcer there.   Psychiatric: Mood, memory, affect and judgment normal.  Nursing note and vitals reviewed.    LABORATORY DATA:  I have reviewed the labs as listed.  CBC    Component Value Date/Time   WBC 5.8 08/22/2017 0948   RBC 4.16 (L) 08/22/2017 0948   HGB 11.1 (L) 08/24/2017 0632   HCT 34.0 (L) 08/24/2017 0632   PLT 209 08/22/2017 0948   MCV 88.7 08/22/2017 0948   MCH 29.6 08/22/2017 0948   MCHC 33.3 08/22/2017 0948   RDW 14.3 08/22/2017 0948   LYMPHSABS 1.0 05/04/2017 1324   MONOABS 0.5 05/04/2017 1324   EOSABS 0.3 05/04/2017 1324   BASOSABS 0.0 05/04/2017 1324   CMP Latest Ref Rng & Units 08/24/2017 08/23/2017 08/22/2017  Glucose 65 - 99 mg/dL 99 148(H) 126(H)  BUN 6 - 20 mg/dL 39(H) 42(H) 43(H)  Creatinine 0.61 - 1.24 mg/dL 2.23(H) 2.19(H) 1.93(H)  Sodium 135 - 145 mmol/L 135 136 139  Potassium 3.5 - 5.1 mmol/L 4.1 4.4 4.4  Chloride 101 - 111 mmol/L 101 106 108  CO2 22 - 32 mmol/L 27 23 24   Calcium 8.9 - 10.3 mg/dL 8.5(L) 8.2(L) 8.7(L)  Total Protein 6.5 - 8.1 g/dL - - -  Total  Bilirubin 0.3 - 1.2 mg/dL - - -  Alkaline Phos 38 - 126 U/L - - -  AST 15 - 41 U/L - - -  ALT 17 - 63 U/L - - -    PENDING LABS:    DIAGNOSTIC IMAGING:  *The following radiologic images and reports have been reviewed independently and  agree with below findings.  CT chest/abd/pelvis: 05/05/17 CLINICAL DATA:  Right renal mass suspicious for malignancy on a previous CT elsewhere in January 2018.  EXAM: CT CHEST, ABDOMEN, AND PELVIS WITH CONTRAST  TECHNIQUE: Multidetector CT imaging of the chest, abdomen and pelvis was performed following the standard protocol during bolus administration of intravenous contrast.  CONTRAST:  162mL ISOVUE-300 IOPAMIDOL (ISOVUE-300) INJECTION 61%  COMPARISON:  Previous examinations at [facility], including an abdomen MR dated 01/10/2017 and previous abdomen and pelvis CT examinations, the most recent dated 01/09/2017.  FINDINGS: CT CHEST FINDINGS  Cardiovascular: Atheromatous arterial calcifications, including the coronary arteries and aorta. Borderline enlarged heart. Small pericardial effusion with a maximum thickness of 9 mm.  Mediastinum/Nodes: Oval, mildly heterogeneous, low-density right lobe thyroid mass with coarse calcifications. This measures 4.3 x 3.1 cm on image number 7 of series 2. No enlarged lymph nodes.  Lungs/Pleura: Mild linear atelectasis or scarring in both lower lung zones. No lung nodules or pleural fluid.  Musculoskeletal: Mild dextroconvex thoracic scoliosis. Thoracic spine degenerative changes. No evidence of bony metastatic disease.  CT ABDOMEN PELVIS FINDINGS  Hepatobiliary: No significant change in multiple liver cysts. The largest is in the medial segment of the left lobe, measuring 9.0 cm in maximum diameter. No solid liver masses are seen.  1.2 cm gallstone in the gallbladder without gallbladder wall thickening or pericholecystic fluid.  Pancreas: Diffuse pancreatic atrophy  with partial fatty replacement.  Spleen: Normal in size without focal abnormality.  Adrenals/Urinary Tract: Previously demonstrated heterogeneous upper pole right renal mass with central low density. This mass measures 8.8 x 6.3 cm on image number 73 series 2. On 01/09/2017, this measured 8.6 x 6.4 cm in corresponding dimensions and on 01/10/2017 this measured 8.7 x 6.2 cm by MR.  Normal appearing adrenal glands. 7 mm left renal collecting system calculus without hydronephrosis. Small exophytic renal cysts bilaterally. No bladder or ureteral calculi or masses.  Stomach/Bowel: Multiple sigmoid colon diverticula without evidence of diverticulitis. Unremarkable stomach, small bowel and appendix.  Vascular/Lymphatic: Atheromatous arterial calcifications. The previously demonstrated infrarenal abdominal aortic aneurysm measures 4.5 cm in maximum diameter. This measured 4.2 cm in maximum diameter on 01/09/2017. No enlarged lymph nodes.  Reproductive: Minimally enlarged prostate gland containing a small amount of coarse calcification.  Other: Small umbilical hernia containing fat.  Musculoskeletal: Mild to moderate levoconvex lumbar scoliosis. Lumbar spine degenerative changes. Interval marked irregularity of the bone on both sides of the L1-2 disc space with interval associated lucency and fragmentation in the posterior, superior aspect of the L2 vertebral body and interval sclerosis in the inferior L1 and upper L2 vertebral bodies. There was anterior soft tissue enhancement at this level on the previous MR.  IMPRESSION: 1. 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 2. Stable upper pole right renal mass with imaging features suspicious for renal cell carcinoma, measuring 8.8 x 6.3 cm in the axial plane today. 3. No evidence of metastatic disease in the chest, abdomen or pelvis. 4. Cholelithiasis without evidence cholecystitis. 5. 4.5 cm infrarenal abdominal  aortic aneurysm with a mild increase in size. Recommend followup by abdomen and pelvis CTA in 6 months, and vascular surgery referral/consultation if not already obtained. This recommendation follows ACR consensus guidelines: [name] on Vascular Findings. [name] 2013; 10:789-794. 6. Coronary artery and aortic atherosclerosis. 7. Sigmoid diverticulosis. 8. 4.3 cm right lobe thyroid nodule. Consider further evaluation with thyroid ultrasound. If patient is  clinically hyperthyroid, consider nuclear medicine thyroid uptake and scan. These results will be called to the ordering clinician or representative by the Radiologist Assistant, and communication documented in the PACS or zVision Dashboard.   Electronically Signed   By: Claudie Revering M.D.   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 Repeat CT imaging on 05/05/17 revealed right renal mass measuring 8.8 cm; no evidence of metastatic disease on imaging.  He was referred to urology for surgical consideration initially with [name] per patient.  On 08/22/17, patient underwent right radical nephrectomy with [name] with surgical pathology revealing renal cell carcinoma, clear cell histology, with disease confined to kidney (T2a).   -Reviewed surgical pathology in detail with patient today. He was provided a copy of the pathology report as well.   -Discussed that based on pre-surgical imaging and surgical path, his kidney cancer is Stage II.  We discussed kidney cancer staging in general terms.  He understands that the intent of his surgery was curative.  He understands that should his cancer recur outside of the kidney (for example to lung or bone), then he  would then have metastatic disease and disease would not be curable at that time.  We briefly reviewed the NCCN Guidelines together for Stage II renal cell carcinoma post-nephrectomy.   -Due for restaging CT chest/abd/pelvis in 11/2017, which will be ~3 months since his surgery.  Orders placed today for scans without contrast given his elevated BUN/CRE and decreased GFR in the pre-op setting.  We certainly want to be mindful of his remaining kidney function moving forward post-nephrectomy.   -Return to cancer center in 11/2017 for follow-up a few days after CT scans and with labs.   NCCN Guidelines reviewed for patients with Stage II disease s/p radical nephrectomy.       Chronic kidney disease:  -Last GFR on 08/24/17 decreased at 27. BUN/CRE elevated at 39/2.23.  -Patient has not seen a nephrologist for CKD to his knowledge.  His restaging CT scans were ordered today without contrast given decreased kidney function and now only 1 remaining kidney.  If BUN/CRE and GFR are still abnormal when he returns in 2 months for follow-up then will place referral to nephrology for evaluation and management.   Anemia:  -Noted on pre-op labs with Hgb 10 and 11.1 g/dL.  Could be secondary to CKD.  Will add-on anemia panel and erythropoietin to his labs when he returns to cancer center in 11/2017.      Dispo:  -Restaging CT chest/abd/pelvis due in 11/2017; orders placed today.  -Return to cancer center in 11/2017 a few days after scans for follow-up with labs.    All questions were answered to patient's stated satisfaction. Encouraged patient to call with any new concerns or questions before his next visit to the cancer center and we can certain see him sooner, if needed.    Plan of care discussed with [name], who agrees with the above aforementioned.    Orders placed this encounter:  Orders Placed This Encounter  Procedures  . CT Chest Wo Contrast  . CT Abdomen Pelvis Wo Contrast  . CBC with  Differential/Platelet  . Vitamin B12  . Folate  . Iron and TIBC  .  Ferritin  . Comprehensive metabolic panel  . Erythropoietin      Mike Craze, NP [place] [phone]

## 2017-09-28 NOTE — Patient Instructions (Addendum)
[place] at [facility] Discharge Instructions  RECOMMENDATIONS MADE BY THE CONSULTANT AND ANY TEST RESULTS WILL BE SENT TO YOUR REFERRING PHYSICIAN.  You were seen today by Mike Craze, NP CT scans in 2 months Follow up in 2 months See schedulers up front for appointments    Thank you for choosing [place] at [facility] to provide your oncology and hematology care.  To afford each patient quality time with our provider, please arrive at least 15 minutes before your scheduled appointment time.    If you have a lab appointment with the [place] please come in thru the  Main Entrance and check in at the main information desk  You need to re-schedule your appointment should you arrive 10 or more minutes late.  We strive to give you quality time with our providers, and arriving late affects you and other patients whose appointments are after yours.  Also, if you no show three or more times for appointments you may be dismissed from the clinic at the providers discretion.     Again, thank you for choosing [facility].  Our hope is that these requests will decrease the amount of time that you wait before being seen by our physicians.       _____________________________________________________________  Should you have questions after your visit to [facility], please contact our office at [phone] between the hours of 8:30 a.m. and 4:30 p.m.  Voicemails left after 4:30 p.m. will not be returned until the following business day.  For prescription refill requests, have your pharmacy contact our office.       Resources For Cancer Patients and their Caregivers ? American Cancer Society: Can assist with transportation, wigs, general needs, runs Look Good Feel Better.        619-670-6224 ? Cancer Care: Provides financial assistance, online support groups, medication/co-pay assistance.  1-800-813-HOPE  585-187-4287) ? [facility] Assists [place] cancer patients and their families through emotional , educational and financial support.  (509)713-3667 ? [place] DSS Where to apply for food stamps, Medicaid and utility assistance. 305-151-3764 ? [facility]: Transportation to medical appointments. (847)257-5208 ? Social Security Administration: May apply for disability if have a Stage IV cancer. 559-161-6621 825 226 3299 ? [employer], Disability and Transit Services: Assists with nutrition, care and transit needs. [place] Support Programs: @10RELATIVEDAYS @ > Cancer Support Group  2nd Tuesday of the month 1pm-2pm, Journey Room  > Creative Journey  3rd Tuesday of the month 1130am-1pm, Journey Room  > Look Good Feel Better  1st Wednesday of the month 10am-12 noon, Journey Room (Call [place] to register [phone])

## 2017-10-04 ENCOUNTER — Ambulatory Visit
Admission: RE | Admit: 2017-10-04 | Discharge: 2017-10-04 | Disposition: A | Discharge status: Home / Self Care | Payer: Medicare Other | Source: Ambulatory Visit | Attending: Surgery | Admitting: Surgery

## 2017-10-04 ENCOUNTER — Other Ambulatory Visit (HOSPITAL_COMMUNITY)
Admission: RE | Admit: 2017-10-04 | Discharge: 2017-10-04 | Disposition: A | Discharge status: Home / Self Care | Payer: Medicare Other | Source: Ambulatory Visit | Attending: Physician Assistant | Admitting: Physician Assistant

## 2017-10-04 DIAGNOSIS — E041 Nontoxic single thyroid nodule: Secondary | ICD-10-CM | POA: Diagnosis present

## 2017-10-04 NOTE — Procedures (Signed)
PROCEDURE SUMMARY:  Using direct ultrasound guidance, 5 passes were made using 25 g needles into the nodule within the right lobe of the thyroid.   Ultrasound was used to confirm needle placements on all occasions.   Specimens were sent to Pathology for analysis.  Shana Zavaleta S Kunio Cummiskey PA-C 10/04/2017 2:53 PM

## 2017-11-28 ENCOUNTER — Ambulatory Visit (HOSPITAL_COMMUNITY)
Admission: RE | Admit: 2017-11-28 | Discharge: 2017-11-28 | Disposition: A | Discharge status: Home / Self Care | Payer: Medicare Other | Source: Ambulatory Visit | Attending: Adult Health | Admitting: Adult Health

## 2017-11-28 DIAGNOSIS — I714 Abdominal aortic aneurysm, without rupture: Secondary | ICD-10-CM | POA: Insufficient documentation

## 2017-11-28 DIAGNOSIS — N210 Calculus in bladder: Secondary | ICD-10-CM | POA: Diagnosis not present

## 2017-11-28 DIAGNOSIS — Z9889 Other specified postprocedural states: Secondary | ICD-10-CM | POA: Diagnosis present

## 2017-11-28 DIAGNOSIS — I2510 Atherosclerotic heart disease of native coronary artery without angina pectoris: Secondary | ICD-10-CM | POA: Insufficient documentation

## 2017-11-28 DIAGNOSIS — K449 Diaphragmatic hernia without obstruction or gangrene: Secondary | ICD-10-CM | POA: Diagnosis not present

## 2017-11-28 DIAGNOSIS — Z905 Acquired absence of kidney: Secondary | ICD-10-CM | POA: Diagnosis not present

## 2017-11-28 DIAGNOSIS — K5730 Diverticulosis of large intestine without perforation or abscess without bleeding: Secondary | ICD-10-CM | POA: Insufficient documentation

## 2017-11-28 DIAGNOSIS — I700 Atherosclerosis of aorta: Secondary | ICD-10-CM | POA: Insufficient documentation

## 2017-11-28 DIAGNOSIS — E042 Nontoxic multinodular goiter: Secondary | ICD-10-CM | POA: Diagnosis not present

## 2017-11-28 DIAGNOSIS — C641 Malignant neoplasm of right kidney, except renal pelvis: Secondary | ICD-10-CM | POA: Insufficient documentation

## 2017-11-28 DIAGNOSIS — I77810 Thoracic aortic ectasia: Secondary | ICD-10-CM | POA: Insufficient documentation

## 2017-11-28 DIAGNOSIS — N400 Enlarged prostate without lower urinary tract symptoms: Secondary | ICD-10-CM | POA: Diagnosis not present

## 2017-11-28 DIAGNOSIS — K8020 Calculus of gallbladder without cholecystitis without obstruction: Secondary | ICD-10-CM | POA: Diagnosis not present

## 2017-11-30 ENCOUNTER — Encounter (HOSPITAL_COMMUNITY): Payer: Self-pay

## 2017-11-30 ENCOUNTER — Other Ambulatory Visit: Payer: Self-pay

## 2017-11-30 ENCOUNTER — Encounter (HOSPITAL_COMMUNITY): Payer: Medicare Other

## 2017-11-30 ENCOUNTER — Encounter (HOSPITAL_COMMUNITY): Payer: Medicare Other | Attending: Adult Health | Admitting: Oncology

## 2017-11-30 VITALS — BP 146/76 | HR 62 | Temp 97.6°F | Resp 20 | Ht 73.0 in | Wt 279.0 lb

## 2017-11-30 DIAGNOSIS — R7989 Other specified abnormal findings of blood chemistry: Secondary | ICD-10-CM

## 2017-11-30 DIAGNOSIS — C641 Malignant neoplasm of right kidney, except renal pelvis: Secondary | ICD-10-CM

## 2017-11-30 DIAGNOSIS — D509 Iron deficiency anemia, unspecified: Secondary | ICD-10-CM | POA: Diagnosis present

## 2017-11-30 DIAGNOSIS — N189 Chronic kidney disease, unspecified: Secondary | ICD-10-CM

## 2017-11-30 DIAGNOSIS — D649 Anemia, unspecified: Secondary | ICD-10-CM | POA: Insufficient documentation

## 2017-11-30 LAB — CBC WITH DIFFERENTIAL/PLATELET
Basophils Absolute: 0 10*3/uL (ref 0.0–0.1)
Basophils Relative: 0 %
EOS PCT: 5 %
Eosinophils Absolute: 0.3 10*3/uL (ref 0.0–0.7)
HCT: 38.1 % — ABNORMAL LOW (ref 39.0–52.0)
HEMOGLOBIN: 12.1 g/dL — AB (ref 13.0–17.0)
LYMPHS ABS: 0.9 10*3/uL (ref 0.7–4.0)
LYMPHS PCT: 16 %
MCH: 30.3 pg (ref 26.0–34.0)
MCHC: 31.8 g/dL (ref 30.0–36.0)
MCV: 95.5 fL (ref 78.0–100.0)
MONOS PCT: 11 %
Monocytes Absolute: 0.6 10*3/uL (ref 0.1–1.0)
NEUTROS PCT: 68 %
Neutro Abs: 3.8 10*3/uL (ref 1.7–7.7)
Platelets: 164 10*3/uL (ref 150–400)
RBC: 3.99 MIL/uL — AB (ref 4.22–5.81)
RDW: 14.6 % (ref 11.5–15.5)
WBC: 5.6 10*3/uL (ref 4.0–10.5)

## 2017-11-30 LAB — FOLATE: Folate: 9.2 ng/mL (ref >5.9)

## 2017-11-30 LAB — COMPREHENSIVE METABOLIC PANEL
ALT: 9 U/L — ABNORMAL LOW (ref 17–63)
ANION GAP: 9 (ref 5–15)
AST: 14 U/L — ABNORMAL LOW (ref 15–41)
Albumin: 3.4 g/dL — ABNORMAL LOW (ref 3.5–5.0)
Alkaline Phosphatase: 79 U/L (ref 38–126)
BUN: 47 mg/dL — ABNORMAL HIGH (ref 6–20)
CHLORIDE: 106 mmol/L (ref 101–111)
CO2: 20 mmol/L — AB (ref 22–32)
Calcium: 8.9 mg/dL (ref 8.9–10.3)
Creatinine, Ser: 1.77 mg/dL — ABNORMAL HIGH (ref 0.61–1.24)
GFR calc non Af Amer: 35 mL/min — ABNORMAL LOW (ref >60)
GFR, EST AFRICAN AMERICAN: 41 mL/min — AB (ref >60)
Glucose, Bld: 112 mg/dL — ABNORMAL HIGH (ref 65–99)
POTASSIUM: 4.4 mmol/L (ref 3.5–5.1)
SODIUM: 135 mmol/L (ref 135–145)
Total Bilirubin: 0.5 mg/dL (ref 0.3–1.2)
Total Protein: 7.1 g/dL (ref 6.5–8.1)

## 2017-11-30 LAB — IRON AND TIBC
Iron: 45 ug/dL (ref 45–182)
SATURATION RATIOS: 15 % — AB (ref 17.9–39.5)
TIBC: 307 ug/dL (ref 250–450)
UIBC: 262 ug/dL

## 2017-11-30 LAB — VITAMIN B12: Vitamin B-12: 168 pg/mL — ABNORMAL LOW (ref 180–914)

## 2017-11-30 LAB — FERRITIN: FERRITIN: 25 ng/mL (ref 24–336)

## 2017-11-30 NOTE — Progress Notes (Signed)
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 note on 06/20/17)  Vincent Pope 76 y.o. male is here because of right renal mass suspicious for renal cell carcinoma. He is currently staying at [facility] and rehabilitation.  His most recent scans demonstrate:  CT abdomen and pelvis with contrast 01/09/17 demonstrated a 8.6 x 6.4 cm complex solid/cystic mass within the superior pole of the right kidney, previously 6.7 x 5.2 cm. Smaller bilateral low attenuation renal lesions are stable when compared to prior exam. Cysts within the left hepatic lobe is increased in size measuring up to 7.4 cm, previously 5.5 cm.  Chest x-ray 01/09/17 demonstrated a low lung volumes with basilar opacities primary and to represent atelectasis.  MRI abdomen with and without contrast 01/10/17 which demonstrated a heterogeneously enhancing mass in the right kidney, 6.2 x 8.7 x 6 cm, the lesion extends up to the renal vein but does not appear to extend into the renal vein at this time. This lesion remains in capsulated with [name], no lymphadenopathy. He also has multiple small renal cysts as well as numerous liver cysts as well.  Patient had gone for surgical evaluation by [name] back on 02/01/17 and was deemed not to be a surgical candidate due to his weakened state. Patient had just arrived at the skilled nursing facility for rehabilitation in February 2018. At that time he could not stand up or get out of his wheelchair and spent most of his time in bed. Per [name] know at that time, patient had abdominal CT dating back  to April 2011 which revealed a 5.4 cm indeterminate cystic focus. Follow-up CT scan in October 2015 revealed a complex cystic mass measuring 6.5 x 5.2 x 4.5 cm in the right upper pole. 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  Currently patient states that his performance status is a lot better. With physical therapy he is now ambulatory. He denies any recent hematuria. He denies any chest pain, shortness breath, abdominal pain, focal weakness. He denies any weight loss, appetite is good.  CT C/A/P 05/05/17 demonstrated stable upper pole right renal mass with imaging features suspicious for renal cell carcinoma, measuring 8.8 x 6.3 cm in the axial plane today. No evidence of metastatic disease in the chest, abdomen or pelvis. LDH was WNL.  (R) radical nephrectomy with [name] on 08/22/17. Pathology revealed clear cell renal cell carcinoma, nuclear grade 3, tumor measuring 8.2 cm and was confined to the kidney; surgical margins negative for carcinoma.  Final stage pT2a pNx.     INTERVAL HISTORY:  [name] 76 y.o. male returns for routine follow-up for right renal cell carcinoma.   Patient states that he has been doing well overall.  He has occasional fatigue.  He denies any changes in appetite, weight loss, chest pain, shortness of breath, abdominal pain, nausea, vomiting, diarrhea, focal weakness.  He continues to stay at rehab but states that his mobility is getting a lot better and he can now ambulate with a cane.  REVIEW OF SYSTEMS:  Review of Systems  Constitutional: Positive for  fatigue. Negative for chills and fever.  HENT:  Negative.  Negative for lump/mass and nosebleeds.   Eyes: Negative.   Respiratory: Negative.  Negative for cough and shortness of breath.   Cardiovascular: Negative.  Negative for chest pain and leg swelling.  Gastrointestinal: Negative.  Negative for abdominal pain, blood in stool, constipation, diarrhea, nausea and vomiting.  Endocrine:  Negative.   Genitourinary: Negative.  Negative for dysuria and hematuria.   Musculoskeletal: Negative.  Negative for arthralgias.  Skin: Negative.  Negative for rash.  Neurological: Negative.  Negative for dizziness and headaches.  Hematological: Negative for adenopathy. Bruises/bleeds easily.  Psychiatric/Behavioral: Negative.  Negative for depression and sleep disturbance. The patient is not nervous/anxious.      PAST MEDICAL/SURGICAL HISTORY:  Past Medical History:  Diagnosis Date  . 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 Atherosclerosis of aorta ([place])   . Atherosclerosis of aorta ([place])   . Atrial fibrillation ([place])   . Benign prostatic hyperplasia   . Cancer [facility])    malignant neoplasm of right kidney per records from [facility]  . Cognitive communication deficit   . Congestive heart failure (CHF) ([place])   . Diverticulitis   . Dysrhythmia    Atrial Fibrillation per records from [facility]  . Hypertension, essential   . Neoplasm of right kidney   . Pneumonia    from records from [facility]  . Thrombocytopenia ([place])   . Vitamin D deficiency    Past Surgical History:  Procedure Laterality Date  . CATARACT EXTRACTION Bilateral   . LAPAROSCOPIC NEPHRECTOMY Right 08/22/2017   Procedure: LAPAROSCOPIC RADICAL NEPHRECTOMY;  Surgeon: Raynelle Bring, MD;  Location: [facility] ORS;  Service: Urology;  Laterality: Right;     SOCIAL HISTORY:  Social History   Socioeconomic History  . Marital status: Single    Spouse name: Not on file  . Number of children: Not on file  . Years of education: Not on file  . Highest education level: Not on file  Social Needs  . Financial resource strain: Not on file  . Food insecurity - worry: Not on file  . Food insecurity - inability: Not on file  . Transportation needs - medical: Not on file  . Transportation needs - non-medical: Not on file  Occupational History  . Not on file  Tobacco Use  .  Smoking status: Former Research scientist (life sciences)  . Smokeless tobacco: Never Used  Substance and Sexual Activity  . Alcohol use: No  . Drug use: No  . Sexual activity: Not Currently  Other Topics Concern  . Not on file  Social History Narrative  . Not on file    FAMILY HISTORY:  No family history on file.  CURRENT MEDICATIONS:  Outpatient Encounter Medications as of 11/30/2017  Medication Sig  . Amino Acids-Protein Hydrolys (FEEDING SUPPLEMENT, PRO-STAT SUGAR FREE 64,) LIQD Take 30 mLs by mouth 3 (three) times daily with meals.  . furosemide (LASIX) 40 MG tablet Take 40 mg by mouth daily.  Marland Kitchen HYDROcodone-acetaminophen (NORCO) 5-325 MG tablet Take 1-2 tablets by mouth every 6 (six) hours as needed for moderate pain or severe pain.  Marland Kitchen lisinopril (PRINIVIL,ZESTRIL) 20 MG tablet Take 20 mg by mouth daily.  . metoprolol tartrate (LOPRESSOR) 25 MG tablet Take 0.5 tablets (12.5 mg total) by mouth 2 (two) times daily.  . pantoprazole (PROTONIX) 40 MG tablet Take 40 mg by mouth daily.  . polyethylene glycol (MIRALAX / GLYCOLAX) packet Take 17 g by  mouth daily.  . tamsulosin (FLOMAX) 0.4 MG CAPS capsule Take 0.4 mg by mouth daily.   No facility-administered encounter medications on file as of 11/30/2017.     ALLERGIES:  No Known Allergies   PHYSICAL EXAM:  ECOG Performance status: 2 - Symptomatic; requires assistance (lives in assisted living/SNF)  Vitals:   11/30/17 1107  BP: (!) 146/76  Pulse: 62  Resp: 20  Temp: 97.6 F (36.4 C)  SpO2: 95%   Filed Weights   11/30/17 1107  Weight: 279 lb (126.6 kg)    Physical Exam  Constitutional: He is oriented to person, place, and time and well-developed, well-nourished, and in no distress.  -Exam done with patient seated in wheelchair   HENT:  Head: Normocephalic.  Mouth/Throat: Oropharynx is clear and moist. No oropharyngeal exudate.  Eyes: Conjunctivae are normal. Pupils are equal, round, and reactive to light. No scleral icterus.  Neck: Normal  range of motion. Neck supple.  Cardiovascular: Normal rate and regular rhythm.  Pulmonary/Chest: Effort normal and breath sounds normal. No respiratory distress. He has no wheezes.  Abdominal: Soft. Bowel sounds are normal. There is no tenderness.  Musculoskeletal: Normal range of motion. He exhibits edema (2+ BLE edema).  Lymphadenopathy:    He has no cervical adenopathy.       Right: No supraclavicular adenopathy present.       Left: No supraclavicular adenopathy present.  Neurological: He is alert and oriented to person, place, and time.  Skin: Skin is warm and dry.  Psychiatric: Mood, memory, affect and judgment normal.  Nursing note and vitals reviewed.    LABORATORY DATA:  I have reviewed the labs as listed.  CBC    Component Value Date/Time   WBC 5.8 08/22/2017 0948   RBC 4.16 (L) 08/22/2017 0948   HGB 11.1 (L) 08/24/2017 0632   HCT 34.0 (L) 08/24/2017 0632   PLT 209 08/22/2017 0948   MCV 88.7 08/22/2017 0948   MCH 29.6 08/22/2017 0948   MCHC 33.3 08/22/2017 0948   RDW 14.3 08/22/2017 0948   LYMPHSABS 1.0 05/04/2017 1324   MONOABS 0.5 05/04/2017 1324   EOSABS 0.3 05/04/2017 1324   BASOSABS 0.0 05/04/2017 1324   CMP Latest Ref Rng & Units 08/24/2017 08/23/2017 08/22/2017  Glucose 65 - 99 mg/dL 99 148(H) 126(H)  BUN 6 - 20 mg/dL 39(H) 42(H) 43(H)  Creatinine 0.61 - 1.24 mg/dL 2.23(H) 2.19(H) 1.93(H)  Sodium 135 - 145 mmol/L 135 136 139  Potassium 3.5 - 5.1 mmol/L 4.1 4.4 4.4  Chloride 101 - 111 mmol/L 101 106 108  CO2 22 - 32 mmol/L 27 23 24   Calcium 8.9 - 10.3 mg/dL 8.5(L) 8.2(L) 8.7(L)  Total Protein 6.5 - 8.1 g/dL - - -  Total Bilirubin 0.3 - 1.2 mg/dL - - -  Alkaline Phos 38 - 126 U/L - - -  AST 15 - 41 U/L - - -  ALT 17 - 63 U/L - - -    PENDING LABS:    DIAGNOSTIC IMAGING:  *The following radiologic images and reports have been reviewed independently and agree with below findings.  CT chest/abd/pelvis: 05/05/17 CLINICAL DATA:  Right renal mass  suspicious for malignancy on a previous CT elsewhere in January 2018.  EXAM: CT CHEST, ABDOMEN, AND PELVIS WITH CONTRAST  TECHNIQUE: Multidetector CT imaging of the chest, abdomen and pelvis was performed following the standard protocol during bolus administration of intravenous contrast.  CONTRAST:  150mL ISOVUE-300 IOPAMIDOL (ISOVUE-300) INJECTION 61%  COMPARISON:  Previous examinations  at [facility], including an abdomen MR dated 01/10/2017 and previous abdomen and pelvis CT examinations, the most recent dated 01/09/2017.  FINDINGS: CT CHEST FINDINGS  Cardiovascular: Atheromatous arterial calcifications, including the coronary arteries and aorta. Borderline enlarged heart. Small pericardial effusion with a maximum thickness of 9 mm.  Mediastinum/Nodes: Oval, mildly heterogeneous, low-density right lobe thyroid mass with coarse calcifications. This measures 4.3 x 3.1 cm on image number 7 of series 2. No enlarged lymph nodes.  Lungs/Pleura: Mild linear atelectasis or scarring in both lower lung zones. No lung nodules or pleural fluid.  Musculoskeletal: Mild dextroconvex thoracic scoliosis. Thoracic spine degenerative changes. No evidence of bony metastatic disease.  CT ABDOMEN PELVIS FINDINGS  Hepatobiliary: No significant change in multiple liver cysts. The largest is in the medial segment of the left lobe, measuring 9.0 cm in maximum diameter. No solid liver masses are seen.  1.2 cm gallstone in the gallbladder without gallbladder wall thickening or pericholecystic fluid.  Pancreas: Diffuse pancreatic atrophy with partial fatty replacement.  Spleen: Normal in size without focal abnormality.  Adrenals/Urinary Tract: Previously demonstrated heterogeneous upper pole right renal mass with central low density. This mass measures 8.8 x 6.3 cm on image number 73 series 2. On 01/09/2017, this measured 8.6 x 6.4 cm in corresponding dimensions  and on 01/10/2017 this measured 8.7 x 6.2 cm by MR.  Normal appearing adrenal glands. 7 mm left renal collecting system calculus without hydronephrosis. Small exophytic renal cysts bilaterally. No bladder or ureteral calculi or masses.  Stomach/Bowel: Multiple sigmoid colon diverticula without evidence of diverticulitis. Unremarkable stomach, small bowel and appendix.  Vascular/Lymphatic: Atheromatous arterial calcifications. The previously demonstrated infrarenal abdominal aortic aneurysm measures 4.5 cm in maximum diameter. This measured 4.2 cm in maximum diameter on 01/09/2017. No enlarged lymph nodes.  Reproductive: Minimally enlarged prostate gland containing a small amount of coarse calcification.  Other: Small umbilical hernia containing fat.  Musculoskeletal: Mild to moderate levoconvex lumbar scoliosis. Lumbar spine degenerative changes. Interval marked irregularity of the bone on both sides of the L1-2 disc space with interval associated lucency and fragmentation in the posterior, superior aspect of the L2 vertebral body and interval sclerosis in the inferior L1 and upper L2 vertebral bodies. There was anterior soft tissue enhancement at this level on the previous MR.  IMPRESSION: 1. 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 2. Stable upper pole right renal mass with imaging features suspicious for renal cell carcinoma, measuring 8.8 x 6.3 cm in the axial plane today. 3. No evidence of metastatic disease in the chest, abdomen or pelvis. 4. Cholelithiasis without evidence cholecystitis. 5. 4.5 cm infrarenal abdominal aortic aneurysm with a mild increase in size. Recommend followup by abdomen and pelvis CTA in 6 months, and vascular surgery referral/consultation if not already obtained. This recommendation follows ACR consensus guidelines: [name] on Vascular Findings. [name] 2013;  10:789-794. 6. Coronary artery and aortic atherosclerosis. 7. Sigmoid diverticulosis. 8. 4.3 cm right lobe thyroid nodule. Consider further evaluation with thyroid ultrasound. If patient is clinically hyperthyroid, consider nuclear medicine thyroid uptake and scan. These results will be called to the ordering clinician or representative by the Radiologist Assistant, and communication documented in the PACS or zVision Dashboard.   Electronically Signed   By: Claudie Revering M.D.   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 Repeat CT imaging on 05/05/17 revealed right renal mass measuring 8.8 cm; no evidence of metastatic disease on imaging.  He was referred to urology for surgical consideration initially with [name] per patient.  On 08/22/17, patient underwent right radical nephrectomy with [name] with surgical pathology revealing renal cell carcinoma, clear cell histology, with disease confined to kidney (T2a).   -Reviewed surgical pathology in detail with patient today. He was provided a copy of the pathology report as well.   -Discussed that based on pre-surgical imaging and surgical path, his kidney cancer is Stage II.  We discussed kidney cancer staging in general terms.  He understands that the intent of his surgery was curative.  He understands that should his cancer recur outside of the kidney (for example to lung or bone), then he would then have metastatic disease and disease would not be curable at that time.  We briefly reviewed the NCCN Guidelines together for Stage II renal cell carcinoma post-nephrectomy.   -I have reviewed patient's restaging CT chest abdomen pelvis without contrast on 11/28/2017 with him in detail.  He has no evidence of recurrent  or metastatic disease.  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  I will make a referral to vascular surgery for evaluation and treatment of these findings. -He did not have labs done prior to this visit, therefore we will send him down to get labs. -Return to clinic in 3 months for follow-up with repeat CBC, CMP, LDH and restaging CT chest abdomen pelvis without contrast.  NCCN Guidelines reviewed for patients with Stage II disease s/p radical nephrectomy.       Chronic kidney disease:  -Stable.  -Recommend for him to see nephrology for evaluation and treatment.   Anemia:  -Noted on pre-op labs with Hgb 10 and 11.1 g/dL.  Could be secondary to CKD. Awaiting for him to go do his labwork.     Dispo:  -Restaging CT chest/abd/pelvis, labs, and follow up in 3 months.    All questions were answered to patient's stated satisfaction. Encouraged patient to call with any new concerns or questions before his next visit to the cancer center and we can certain see him sooner, if needed.    Twana First, MD

## 2017-12-01 ENCOUNTER — Other Ambulatory Visit (HOSPITAL_COMMUNITY): Payer: Self-pay | Admitting: Adult Health

## 2017-12-01 DIAGNOSIS — D509 Iron deficiency anemia, unspecified: Secondary | ICD-10-CM | POA: Insufficient documentation

## 2017-12-01 LAB — ERYTHROPOIETIN: Erythropoietin: 16.9 m[IU]/mL (ref 2.6–18.5)

## 2017-12-02 ENCOUNTER — Encounter (HOSPITAL_COMMUNITY): Payer: Self-pay | Admitting: Lab

## 2017-12-02 NOTE — Progress Notes (Unsigned)
Referral to Dr Dorris Fetch.  Records faxed on 12/21

## 2017-12-09 ENCOUNTER — Encounter (HOSPITAL_COMMUNITY): Payer: Self-pay

## 2017-12-09 ENCOUNTER — Other Ambulatory Visit: Payer: Self-pay

## 2017-12-09 ENCOUNTER — Encounter (HOSPITAL_BASED_OUTPATIENT_CLINIC_OR_DEPARTMENT_OTHER): Payer: Medicare Other

## 2017-12-09 VITALS — BP 114/38 | HR 54 | Temp 97.7°F | Resp 18

## 2017-12-09 DIAGNOSIS — D649 Anemia, unspecified: Secondary | ICD-10-CM | POA: Diagnosis not present

## 2017-12-09 DIAGNOSIS — D509 Iron deficiency anemia, unspecified: Secondary | ICD-10-CM

## 2017-12-09 MED ORDER — FERUMOXYTOL INJECTION 510 MG/17 ML
510.0000 mg | Freq: Once | INTRAVENOUS | Status: AC
  Administered 2017-12-09: 510 mg via INTRAVENOUS
  Filled 2017-12-09: qty 17

## 2017-12-09 MED ORDER — SODIUM CHLORIDE 0.9% FLUSH: 3.0000 mL | Freq: Once | INTRAVENOUS | Status: DC | PRN

## 2017-12-09 MED ORDER — SODIUM CHLORIDE 0.9 % IV SOLN
Freq: Once | INTRAVENOUS | Status: AC
  Administered 2017-12-09: 14:00:00 via INTRAVENOUS

## 2017-12-09 NOTE — Progress Notes (Signed)
Tolerated infusion w/o adverse reaction.  Alert, in no distress. VSS.  Discharged via wheelchair in c/o caretaker.  

## 2017-12-29 ENCOUNTER — Other Ambulatory Visit: Payer: Medicare Other

## 2018-01-02 ENCOUNTER — Ambulatory Visit (INDEPENDENT_AMBULATORY_CARE_PROVIDER_SITE_OTHER): Payer: Medicare Other | Admitting: Surgery

## 2018-01-02 ENCOUNTER — Encounter: Payer: Self-pay | Admitting: Surgery

## 2018-01-02 VITALS — BP 152/80 | HR 65 | Resp 18 | Ht 73.0 in | Wt 279.0 lb

## 2018-01-02 DIAGNOSIS — I714 Abdominal aortic aneurysm, without rupture, unspecified: Secondary | ICD-10-CM

## 2018-01-02 NOTE — Progress Notes (Signed)
Vascular and Vein Specialist of [facility]  Patient name: [name] MRN: [id number] DOB: 1941-08-01 Sex: male   REQUESTING PROVIDER:    Dr. Mal Amabile   REASON FOR CONSULT:    AAA  HISTORY OF PRESENT ILLNESS:   Vincent Pope is a 77 y.o. male, who is referred today for evaluation of a abdominal aortic aneurysm that was discovered on a CT scan for a complex renal mass on the right kidney.  Patient has a history of atrial fibrillation but does not appear to be on anticoagulation.  He is medically managed for hypertension.  He has a history of congestive heart failure.  Patient complains of bilateral lower extremity edema.  He has a new wound on his left leg.  PAST MEDICAL HISTORY    Past Medical History:  Diagnosis Date  . AAA (abdominal aortic aneurysm) ([place])   . 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 Atherosclerosis of aorta ([place])   . Atherosclerosis of aorta ([place])   . Atrial fibrillation ([place])   . Benign prostatic hyperplasia   . Cancer [facility])    malignant neoplasm of right kidney per records from [facility]  . Cognitive communication deficit   . Congestive heart failure (CHF) ([place])   . Diverticulitis   . Dysrhythmia    Atrial Fibrillation per records from [facility]  . Hypertension, essential   . Neoplasm of right kidney   . Pneumonia    from records from [facility]  . Thrombocytopenia ([place])   . Vitamin D deficiency      FAMILY HISTORY   History reviewed. No pertinent family history.  SOCIAL HISTORY:   Social History   Socioeconomic History  . Marital status: Single    Spouse name: Not on file  . Number of children: Not on file  . Years of education: Not on file  . Highest education level: Not on file  Social Needs  . Financial resource strain: Not on file  . Food insecurity - worry: Not on file  . Food insecurity - inability: Not on file  . Transportation needs -  medical: Not on file  . Transportation needs - non-medical: Not on file  Occupational History  . Not on file  Tobacco Use  . Smoking status: Former Research scientist (life sciences)  . Smokeless tobacco: Never Used  Substance and Sexual Activity  . Alcohol use: No  . Drug use: No  . Sexual activity: Not Currently  Other Topics Concern  . Not on file  Social History Narrative  . Not on file    ALLERGIES:    No Known Allergies  CURRENT MEDICATIONS:    Current Outpatient Medications  Medication Sig Dispense Refill  . cyanocobalamin (,VITAMIN B-12,) 1000 MCG/ML injection Inject 1,000 mcg into the muscle every 30 (thirty) days.    . furosemide (LASIX) 40 MG tablet Take 40 mg by mouth daily.    Marland Kitchen lisinopril (PRINIVIL,ZESTRIL) 20 MG tablet Take 20 mg by mouth daily.    . metoprolol tartrate (LOPRESSOR) 25 MG tablet Take 0.5 tablets (12.5 mg total) by mouth 2 (two) times daily. 60 tablet 0  . pantoprazole (PROTONIX) 40 MG tablet Take 40 mg by mouth daily.    . polyethylene glycol (MIRALAX / GLYCOLAX) packet Take 17 g by mouth daily.    . tamsulosin (FLOMAX) 0.4 MG CAPS capsule Take 0.4 mg by mouth daily.    . Amino Acids-Protein Hydrolys (FEEDING SUPPLEMENT, PRO-STAT SUGAR FREE 64,) LIQD Take 30 mLs  by mouth 3 (three) times daily with meals.    Marland Kitchen HYDROcodone-acetaminophen (NORCO) 5-325 MG tablet Take 1-2 tablets by mouth every 6 (six) hours as needed for moderate pain or severe pain. (Patient not taking: Reported on 01/02/2018) 30 tablet 0   No current facility-administered medications for this visit.     REVIEW OF SYSTEMS:   [X]  denotes positive finding, [ ]  denotes negative finding Cardiac  Comments:  Chest pain or chest pressure:    Shortness of breath upon exertion:    Short of breath when lying flat:    Irregular heart rhythm:        Vascular    Pain in calf, thigh, or hip brought on by ambulation:    Pain in feet at night that wakes you up from your sleep:     Blood clot in your veins:    Leg  swelling:  x       Pulmonary    Oxygen at home:    Productive cough:     Wheezing:         Neurologic    Sudden weakness in arms or legs:     Sudden numbness in arms or legs:     Sudden onset of difficulty speaking or slurred speech:    Temporary loss of vision in one eye:     Problems with dizziness:         Gastrointestinal    Blood in stool:      Vomited blood:         Genitourinary    Burning when urinating:     Blood in urine:        Psychiatric    Major depression:         Hematologic    Bleeding problems:    Problems with blood clotting too easily:        Skin    Rashes or ulcers:        Constitutional    Fever or chills:     PHYSICAL EXAM:   Vitals:   01/02/18 1354 01/02/18 1356  BP: (!) 150/75 (!) 152/80  Pulse: 65   Resp: 18   SpO2: 99%   Weight: 279 lb (126.6 kg)   Height: 6\' 1"  (1.854 m)     GENERAL: The patient is a well-nourished male, in no acute distress. The vital signs are documented above. CARDIAC: There is a regular rate and rhythm.  VASCULAR: Pedal pulses are nonpalpable.  Significant bilateral lower extremity pitting edema.  No carotid bruits PULMONARY: Nonlabored respirations ABDOMEN: Soft and non-tender.  Aorta is not palpable MUSCULOSKELETAL: There are no major deformities or cyanosis. NEUROLOGIC: No focal weakness or paresthesias are detected. SKIN: There are no ulcers or rashes noted. PSYCHIATRIC: The patient has a normal affect.  STUDIES:   I have evaluated the patient CT scan.  This shows a infrarenal 4.5 cm aneurysm.  ASSESSMENT and PLAN   AAA: Maximum diameter by CT scan is 4.5 cm.  I discussed with the patient that I would recommend repair once the aneurysm becomes 5.0-5.5 cm in size.  I will continue to monitor him every 6 months with ultrasound.  We did discuss that he would need another CT angiogram once the aneurysm becomes large enough in order to evaluate him for a stent graft.  He will also need carotid Dopplers  and ABIs with lower extremity duplex prior to any endovascular repair.  Annamarie Major, MD [facility] Specialists of [facility] [phone]  [phone] Pager [phone]

## 2018-01-11 NOTE — Addendum Note (Signed)
Addended by: Lianne Cure A on: 01/11/2018 12:38 PM   Modules accepted: Orders

## 2018-02-23 ENCOUNTER — Other Ambulatory Visit (HOSPITAL_COMMUNITY): Payer: Self-pay

## 2018-02-23 DIAGNOSIS — C641 Malignant neoplasm of right kidney, except renal pelvis: Secondary | ICD-10-CM

## 2018-02-23 DIAGNOSIS — D509 Iron deficiency anemia, unspecified: Secondary | ICD-10-CM

## 2018-02-24 ENCOUNTER — Other Ambulatory Visit (HOSPITAL_COMMUNITY): Payer: Medicare Other

## 2018-02-24 ENCOUNTER — Ambulatory Visit (HOSPITAL_COMMUNITY): Admission: RE | Admit: 2018-02-24 | Payer: Medicare Other | Source: Ambulatory Visit

## 2018-02-28 ENCOUNTER — Ambulatory Visit (HOSPITAL_COMMUNITY): Payer: Medicare Other | Admitting: Internal Medicine

## 2018-03-02 ENCOUNTER — Other Ambulatory Visit (HOSPITAL_COMMUNITY): Payer: Medicare Other

## 2018-03-02 ENCOUNTER — Inpatient Hospital Stay (HOSPITAL_COMMUNITY): Payer: Medicare Other | Attending: Internal Medicine

## 2018-03-02 DIAGNOSIS — D509 Iron deficiency anemia, unspecified: Secondary | ICD-10-CM | POA: Diagnosis not present

## 2018-03-02 DIAGNOSIS — C641 Malignant neoplasm of right kidney, except renal pelvis: Secondary | ICD-10-CM

## 2018-03-02 DIAGNOSIS — C649 Malignant neoplasm of unspecified kidney, except renal pelvis: Secondary | ICD-10-CM | POA: Insufficient documentation

## 2018-03-02 LAB — COMPREHENSIVE METABOLIC PANEL
ALBUMIN: 3.4 g/dL — AB (ref 3.5–5.0)
ALK PHOS: 81 U/L (ref 38–126)
ALT: 11 U/L — AB (ref 17–63)
AST: 13 U/L — AB (ref 15–41)
Anion gap: 10 (ref 5–15)
BILIRUBIN TOTAL: 0.7 mg/dL (ref 0.3–1.2)
BUN: 32 mg/dL — AB (ref 6–20)
CO2: 24 mmol/L (ref 22–32)
CREATININE: 1.59 mg/dL — AB (ref 0.61–1.24)
Calcium: 8.9 mg/dL (ref 8.9–10.3)
Chloride: 102 mmol/L (ref 101–111)
GFR calc Af Amer: 47 mL/min — ABNORMAL LOW (ref >60)
GFR calc non Af Amer: 40 mL/min — ABNORMAL LOW (ref >60)
GLUCOSE: 96 mg/dL (ref 65–99)
POTASSIUM: 4.3 mmol/L (ref 3.5–5.1)
Sodium: 136 mmol/L (ref 135–145)
TOTAL PROTEIN: 7.3 g/dL (ref 6.5–8.1)

## 2018-03-02 LAB — CBC WITH DIFFERENTIAL/PLATELET
BASOS ABS: 0 10*3/uL (ref 0.0–0.1)
BASOS PCT: 0 %
Eosinophils Absolute: 0.2 10*3/uL (ref 0.0–0.7)
Eosinophils Relative: 3 %
HEMATOCRIT: 41.5 % (ref 39.0–52.0)
HEMOGLOBIN: 13.2 g/dL (ref 13.0–17.0)
LYMPHS PCT: 16 %
Lymphs Abs: 0.9 10*3/uL (ref 0.7–4.0)
MCH: 30.3 pg (ref 26.0–34.0)
MCHC: 31.8 g/dL (ref 30.0–36.0)
MCV: 95.2 fL (ref 78.0–100.0)
Monocytes Absolute: 0.6 10*3/uL (ref 0.1–1.0)
Monocytes Relative: 10 %
NEUTROS ABS: 4.1 10*3/uL (ref 1.7–7.7)
NEUTROS PCT: 71 %
Platelets: 200 10*3/uL (ref 150–400)
RBC: 4.36 MIL/uL (ref 4.22–5.81)
RDW: 14 % (ref 11.5–15.5)
WBC: 5.8 10*3/uL (ref 4.0–10.5)

## 2018-03-02 LAB — LACTATE DEHYDROGENASE: LDH: 123 U/L (ref 98–192)

## 2018-03-10 ENCOUNTER — Ambulatory Visit (HOSPITAL_COMMUNITY)
Admission: RE | Admit: 2018-03-10 | Discharge: 2018-03-10 | Disposition: A | Discharge status: Home / Self Care | Payer: Medicare Other | Source: Ambulatory Visit | Attending: Oncology | Admitting: Oncology

## 2018-03-10 DIAGNOSIS — N210 Calculus in bladder: Secondary | ICD-10-CM | POA: Insufficient documentation

## 2018-03-10 DIAGNOSIS — K7689 Other specified diseases of liver: Secondary | ICD-10-CM | POA: Insufficient documentation

## 2018-03-10 DIAGNOSIS — I2510 Atherosclerotic heart disease of native coronary artery without angina pectoris: Secondary | ICD-10-CM | POA: Diagnosis not present

## 2018-03-10 DIAGNOSIS — I700 Atherosclerosis of aorta: Secondary | ICD-10-CM | POA: Diagnosis not present

## 2018-03-10 DIAGNOSIS — N400 Enlarged prostate without lower urinary tract symptoms: Secondary | ICD-10-CM | POA: Diagnosis not present

## 2018-03-10 DIAGNOSIS — I714 Abdominal aortic aneurysm, without rupture: Secondary | ICD-10-CM | POA: Diagnosis not present

## 2018-03-10 DIAGNOSIS — K8020 Calculus of gallbladder without cholecystitis without obstruction: Secondary | ICD-10-CM | POA: Insufficient documentation

## 2018-03-10 DIAGNOSIS — E049 Nontoxic goiter, unspecified: Secondary | ICD-10-CM | POA: Insufficient documentation

## 2018-03-10 DIAGNOSIS — C641 Malignant neoplasm of right kidney, except renal pelvis: Secondary | ICD-10-CM | POA: Diagnosis not present

## 2018-03-10 DIAGNOSIS — M47896 Other spondylosis, lumbar region: Secondary | ICD-10-CM | POA: Insufficient documentation

## 2018-03-10 DIAGNOSIS — M5136 Other intervertebral disc degeneration, lumbar region: Secondary | ICD-10-CM | POA: Insufficient documentation

## 2018-03-10 DIAGNOSIS — K5730 Diverticulosis of large intestine without perforation or abscess without bleeding: Secondary | ICD-10-CM | POA: Diagnosis not present

## 2018-03-14 ENCOUNTER — Encounter: Payer: Self-pay | Admitting: Cardiovascular Disease

## 2018-03-14 ENCOUNTER — Ambulatory Visit (INDEPENDENT_AMBULATORY_CARE_PROVIDER_SITE_OTHER): Payer: Medicare Other | Admitting: Cardiovascular Disease

## 2018-03-14 VITALS — BP 146/68 | HR 56 | Ht 73.0 in | Wt 296.0 lb

## 2018-03-14 DIAGNOSIS — I10 Essential (primary) hypertension: Secondary | ICD-10-CM

## 2018-03-14 DIAGNOSIS — I714 Abdominal aortic aneurysm, without rupture, unspecified: Secondary | ICD-10-CM

## 2018-03-14 DIAGNOSIS — I482 Chronic atrial fibrillation: Secondary | ICD-10-CM | POA: Diagnosis not present

## 2018-03-14 DIAGNOSIS — I25118 Atherosclerotic heart disease of native coronary artery with other forms of angina pectoris: Secondary | ICD-10-CM

## 2018-03-14 DIAGNOSIS — I712 Thoracic aortic aneurysm, without rupture, unspecified: Secondary | ICD-10-CM

## 2018-03-14 DIAGNOSIS — I4821 Permanent atrial fibrillation: Secondary | ICD-10-CM

## 2018-03-14 MED ORDER — ATORVASTATIN CALCIUM 20 MG PO TABS: 20.0000 mg | ORAL_TABLET | Freq: Every day | ORAL | 3 refills | Status: DC

## 2018-03-14 MED ORDER — RIVAROXABAN 15 MG PO TABS: 15.0000 mg | ORAL_TABLET | Freq: Every day | ORAL | 6 refills | Status: AC

## 2018-03-14 NOTE — Progress Notes (Signed)
SUBJECTIVE: Patient presents for follow-up of atrial fibrillation.  While hospitalized in September 2018, he was experiencing asymptomatic pauses at night up to 2.5 seconds.  Metoprolol was decreased to 12.5 mg twice daily. Event monitoring in October 2018 demonstrated atrial fibrillation and flutter with an average heart rate of 65 bpm.  One isolated 3-second pause was noted. He has not been anticoagulated due to hematuria.  He underwent a right laparoscopic radical nephrectomy on 08/22/17.  CT of the chest on 03/10/18 demonstrated ascending thoracic aortic diameter of 4.3 cm.  There was also an infrarenal bilobed abdominal aortic aneurysm measuring up to 4.1 cm.  Nuclear stress test on 06/29/17 showed prior inferior wall myocardial infarction with peri-infarct ischemia and additional variable soft tissue attenuation artifact.  It was deemed an intermediate risk study.  He currently denies chest pain, shortness of breath, orthopnea, paroxysmal nocturnal dyspnea, palpitations, and hematuria.  I reviewed labs performed on 03/01/18: Hemoglobin 12.4, BUN 32, creatinine 1.47, GFR 45.  He resides at [facility].  Review of Systems: As per "subjective", otherwise negative.  No Known Allergies  Current Outpatient Medications  Medication Sig Dispense Refill  . Amino Acids-Protein Hydrolys (FEEDING SUPPLEMENT, PRO-STAT SUGAR FREE 64,) LIQD Take 30 mLs by mouth 3 (three) times daily with meals.    . cyanocobalamin (,VITAMIN B-12,) 1000 MCG/ML injection Inject 1,000 mcg into the muscle every 30 (thirty) days.    . furosemide (LASIX) 40 MG tablet Take 40 mg by mouth daily.    Marland Kitchen lisinopril (PRINIVIL,ZESTRIL) 20 MG tablet Take 20 mg by mouth daily.    . metoprolol tartrate (LOPRESSOR) 25 MG tablet Take 0.5 tablets (12.5 mg total) by mouth 2 (two) times daily. 60 tablet 0  . pantoprazole (PROTONIX) 40 MG tablet Take 40 mg by mouth daily.    . polyethylene glycol (MIRALAX / GLYCOLAX) packet Take  17 g by mouth daily.    . tamsulosin (FLOMAX) 0.4 MG CAPS capsule Take 0.4 mg by mouth daily.     No current facility-administered medications for this visit.     Past Medical History:  Diagnosis Date  . AAA (abdominal aortic aneurysm) ([place])   . Abdominal aortic ectasia ([place])   . Atherosclerosis of aorta ([place])   . Atherosclerosis of aorta ([place])   . Atrial fibrillation ([place])   . Benign prostatic hyperplasia   . Cancer [facility])    malignant neoplasm of right kidney per records from [facility]  . Cognitive communication deficit   . Congestive heart failure (CHF) ([place])   . Diverticulitis   . Dysrhythmia    Atrial Fibrillation [facility] from [facility]  . Hypertension, essential   . Neoplasm of right kidney   . Pneumonia    from [facility]  . Thrombocytopenia ([place])   . Vitamin D deficiency     Past Surgical History:  Procedure Laterality Date  . CATARACT EXTRACTION Bilateral   . LAPAROSCOPIC NEPHRECTOMY Right 08/22/2017   Procedure: LAPAROSCOPIC RADICAL NEPHRECTOMY;  Surgeon: Raynelle Bring, MD;  Location: [facility] ORS;  Service: Urology;  Laterality: Right;    Social History   Socioeconomic History  . Marital status: Single    Spouse name: Not on file  . Number of children: Not on file  . Years of education: Not on file  . Highest education level: Not on file  Occupational History  . Not on file  Social Needs  . Financial resource strain: Not  on file  . Food insecurity:    Worry: Not on file    Inability: Not on file  . Transportation needs:    Medical: Not on file    Non-medical: Not on file  Tobacco Use  . Smoking status: Former Research scientist (life sciences)  . Smokeless tobacco: Never Used  Substance and Sexual Activity  . Alcohol use: No  . Drug use: No  . Sexual activity: Not Currently  Lifestyle  . Physical activity:    Days per week: Not on file    Minutes per session: Not on file  . Stress: Not on file    Relationships  . Social connections:    Talks on phone: Not on file    Gets together: Not on file    Attends religious service: Not on file    Active member of club or organization: Not on file    Attends meetings of clubs or organizations: Not on file    Relationship status: Not on file  . Intimate partner violence:    Fear of current or ex partner: Not on file    Emotionally abused: Not on file    Physically abused: Not on file    Forced sexual activity: Not on file  Other Topics Concern  . Not on file  Social History Narrative  . Not on file     Vitals:   03/14/18 1012  BP: (!) 146/68  Pulse: (!) 56  SpO2: 98%  Weight: 296 lb (134.3 kg)  Height: 6\' 1"  (1.854 m)    Wt Readings from Last 3 Encounters:  03/14/18 296 lb (134.3 kg)  01/02/18 279 lb (126.6 kg)  11/30/17 279 lb (126.6 kg)     PHYSICAL EXAM General: NAD HEENT: Normal. Neck: No JVD, no thyromegaly. Lungs: Clear to auscultation bilaterally with normal respiratory effort. CV: Regular rate and irregular rhythm, normal S1/S2, no S3, no murmur. Trace bilateral lower extremity edema.  Abdomen: Soft, nontender, no distention.  Neurologic: Alert and oriented.  Psych: Normal affect. Skin: Normal. Musculoskeletal: No gross deformities.    ECG: Most recent ECG reviewed.   Labs: Lab Results  Component Value Date/Time   K 4.3 03/02/2018 10:08 AM   BUN 32 (H) 03/02/2018 10:08 AM   CREATININE 1.59 (H) 03/02/2018 10:08 AM   ALT 11 (L) 03/02/2018 10:08 AM   HGB 13.2 03/02/2018 10:08 AM     Lipids: No results found for: LDLCALC, LDLDIRECT, CHOL, TRIG, HDL     ASSESSMENT AND PLAN: 1.  Permanent atrial fibrillation: Symptomatically stable.  Heart rate is controlled on metoprolol 12.5 mg twice daily.  Higher doses of metoprolol led to asymptomatic pauses while sleeping and the dose was reduced in September 2018. CHADSVASC score is 4 thus at elevated thromboembolic risk.  As he is no longer experiencing  hematuria and hemoglobin is 12.4, I will start renally dosed Xarelto 15 mg daily.  2.  Ascending thoracic aortic aneurysm: I will repeat a CT of the chest in 1 year.  3.  Abdominal aortic aneurysm: Followed by vascular surgery.  He will have an abdominal aortic ultrasound on 07/10/18.  4.  Coronary artery disease: He is currently on metoprolol.  I will add Lipitor 20 mg.  I will not add aspirin as I am starting Xarelto.  5.  Hypertension: Blood pressure is mildly elevated.  He is on lisinopril and metoprolol.  This will need further monitoring.   Disposition: Follow up 6 months  Time spent: 40 minutes, of which greater than  50% was spent reviewing symptoms, relevant blood tests and studies, and discussing management plan with the patient.    Kate Sable, M.D., F.A.C.C.

## 2018-03-14 NOTE — Patient Instructions (Addendum)
Your physician wants you to follow-up in: 6 months with Dr Virgina Jock will receive a reminder letter in the mail two months in advance. If you don't receive a letter, please call our office to schedule the follow-up appointment.     START Xarelto 15 mg daily     START Atorvastatin 20 mg daily at dinner   No lab work or tests ordered today    If you need a refill on your cardiac medications before your next appointment, please call your pharmacy.      Thank you for choosing [facility] !

## 2018-03-15 ENCOUNTER — Ambulatory Visit (HOSPITAL_COMMUNITY): Payer: Medicare Other | Admitting: Internal Medicine

## 2018-03-16 ENCOUNTER — Encounter (HOSPITAL_COMMUNITY): Payer: Self-pay | Admitting: Internal Medicine

## 2018-03-16 ENCOUNTER — Other Ambulatory Visit: Payer: Self-pay

## 2018-03-16 ENCOUNTER — Inpatient Hospital Stay (HOSPITAL_COMMUNITY): Payer: Medicare Other | Attending: Internal Medicine | Admitting: Internal Medicine

## 2018-03-16 VITALS — BP 145/57 | HR 50 | Temp 98.0°F | Resp 20 | Ht 73.0 in | Wt 292.0 lb

## 2018-03-16 DIAGNOSIS — C641 Malignant neoplasm of right kidney, except renal pelvis: Secondary | ICD-10-CM | POA: Diagnosis present

## 2018-03-16 DIAGNOSIS — D509 Iron deficiency anemia, unspecified: Secondary | ICD-10-CM | POA: Diagnosis not present

## 2018-03-16 DIAGNOSIS — Z87891 Personal history of nicotine dependence: Secondary | ICD-10-CM | POA: Insufficient documentation

## 2018-03-16 DIAGNOSIS — R918 Other nonspecific abnormal finding of lung field: Secondary | ICD-10-CM | POA: Insufficient documentation

## 2018-03-16 DIAGNOSIS — Z7982 Long term (current) use of aspirin: Secondary | ICD-10-CM | POA: Insufficient documentation

## 2018-03-16 DIAGNOSIS — Z79899 Other long term (current) drug therapy: Secondary | ICD-10-CM | POA: Insufficient documentation

## 2018-03-16 DIAGNOSIS — I4891 Unspecified atrial fibrillation: Secondary | ICD-10-CM | POA: Diagnosis not present

## 2018-03-16 DIAGNOSIS — I700 Atherosclerosis of aorta: Secondary | ICD-10-CM | POA: Diagnosis not present

## 2018-03-16 DIAGNOSIS — Z800 Family history of malignant neoplasm of digestive organs: Secondary | ICD-10-CM | POA: Diagnosis not present

## 2018-03-16 DIAGNOSIS — N400 Enlarged prostate without lower urinary tract symptoms: Secondary | ICD-10-CM | POA: Insufficient documentation

## 2018-03-16 DIAGNOSIS — E041 Nontoxic single thyroid nodule: Secondary | ICD-10-CM | POA: Insufficient documentation

## 2018-03-16 DIAGNOSIS — I714 Abdominal aortic aneurysm, without rupture: Secondary | ICD-10-CM | POA: Insufficient documentation

## 2018-03-16 DIAGNOSIS — I509 Heart failure, unspecified: Secondary | ICD-10-CM | POA: Diagnosis not present

## 2018-03-16 DIAGNOSIS — N189 Chronic kidney disease, unspecified: Secondary | ICD-10-CM | POA: Insufficient documentation

## 2018-03-16 DIAGNOSIS — Z85038 Personal history of other malignant neoplasm of large intestine: Secondary | ICD-10-CM | POA: Insufficient documentation

## 2018-03-16 DIAGNOSIS — E559 Vitamin D deficiency, unspecified: Secondary | ICD-10-CM | POA: Diagnosis not present

## 2018-03-16 DIAGNOSIS — Z7901 Long term (current) use of anticoagulants: Secondary | ICD-10-CM | POA: Diagnosis not present

## 2018-03-16 DIAGNOSIS — I129 Hypertensive chronic kidney disease with stage 1 through stage 4 chronic kidney disease, or unspecified chronic kidney disease: Secondary | ICD-10-CM | POA: Diagnosis not present

## 2018-03-16 NOTE — Patient Instructions (Signed)
[place] at [facility]  Discharge Instructions:  Scans in July with visit with th oncologist.  _______________________________________________________________  Thank you for choosing [place] at [facility] to provide your oncology and hematology care.  To afford each patient quality time with our providers, please arrive at least 15 minutes before your scheduled appointment.  You need to re-schedule your appointment if you arrive 10 or more minutes late.  We strive to give you quality time with our providers, and arriving late affects you and other patients whose appointments are after yours.  Also, if you no show three or more times for appointments you may be dismissed from the clinic.  Again, thank you for choosing [place] at [place] hope is that these requests will allow you access to exceptional care and in a timely manner. _______________________________________________________________  If you have questions after your visit, please contact our office at [phone] between the hours of 8:30 a.m. and 5:00 p.m. Voicemails left after 4:30 p.m. will not be returned until the following business day. _______________________________________________________________  For prescription refill requests, have your pharmacy contact our office. _______________________________________________________________  Recommendations made by the consultant and any test results will be sent to your referring physician. _______________________________________________________________

## 2018-03-16 NOTE — Progress Notes (Signed)
Diagnosis Renal cell carcinoma of right kidney ([place]) - Plan: CT Chest W Contrast, CT Abdomen Pelvis W Contrast, CBC with Differential/Platelet, Comprehensive metabolic panel, Lactate dehydrogenase  Staging Cancer Staging No matching staging information was found for the patient.  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.  Patient had a right kidney mass reportedly noted on outside imaging from 12/2016. Repeat CT imaging on 05/05/17 revealed right renal mass measuring 8.8 cm; no evidence of metastatic disease on imaging.  He was referred to urology for surgical consideration initially with [name] per patient.  On 08/22/17, patient underwent right radical nephrectomy with [name] with surgical pathology revealing renal cell carcinoma, clear cell histology, with disease confined to kidney (T2a).    Patient is here today to go over his CT chest abdomen and pelvis that was done on 03/10/2018 which showed no evidence of active or recurrent malignancy.  He has the abdominal aortic aneurysm measuring 4.1 cm.  Follow-up is recommended with ultrasound in 1 year.  The patient will return to clinic in July 2019 for follow-up and repeat scans.  He is asymptomatic and should notify the office if he has any change in symptoms prior to that appointment.  2.  Abdominal aortic aneurysm.  He is followed by vascular surgery.  Aneurysm measured 4.1 cm on recent imaging that was done 03/10/2018.  3.  Atrial fibrillation.  He reports he was recently diagnosed and started on Xarelto this morning.  Labs done 03/02/2018 show a white count of 5.8 hemoglobin 13.2 platelets 200,000.  He will continue to follow with cardiology as recommended.  4.  Family history of colon cancer.  He  reports this occurred in a brother.  The patient has never undergone colonoscopy.  I discussed with him options of GI referral but he desires to wait on referral.  This will continue to be discussed on subsequent visits.  5.  IDA.  Hemoglobin 13.2 on labs done 03/02/2018.  He will have repeat labs on return to clinic in July 2019.  6.  Chronic kidney disease.  Labs done 03/02/2018 show a creatinine of 1.6.  Will discuss with radiology if patient would be able to have contrasted CTs.    Interval history:   (From [name] note on 06/20/17)  Vincent Pope 77 y.o. male is here because of right renal mass suspicious for renal cell carcinoma. He is currently staying at [facility] and rehabilitation.  His most recent scans demonstrate:  CT abdomen and pelvis with contrast 01/09/17 demonstrated a 8.6 x 6.4 cm complex solid/cystic mass within the superior pole of the right kidney, previously 6.7 x 5.2 cm. Smaller bilateral low attenuation renal lesions are stable when compared to prior exam. Cysts within the left hepatic lobe is increased in size measuring up to 7.4 cm, previously 5.5 cm.  Chest x-ray 01/09/17 demonstrated a low lung volumes with basilar opacities primary and to represent atelectasis.  MRI abdomen with and without contrast 01/10/17 which demonstrated a heterogeneously enhancing mass in the right kidney, 6.2 x 8.7 x 6 cm, the lesion extends up to the renal vein but does not appear to extend into the renal vein at this time. This lesion remains in capsulated with [name], no lymphadenopathy.  He also has multiple small renal cysts as well as numerous liver cysts as well.  Patient had gone for surgical evaluation by [name] back on 02/01/17 and was deemed not to be a surgical candidate due to his weakened state. Patient had just arrived at the skilled nursing facility for rehabilitation in February 2018. At that time he could not stand up or get out of his  wheelchair and spent most of his time in bed. Per [name] know at that time, patient had abdominal CT dating back to April 2011 which revealed a 5.4 cm indeterminate cystic focus. Follow-up CT scan in October 2015 revealed a complex cystic mass measuring 6.5 x 5.2 x 4.5 cm in the right upper pole. 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  Currently patient states that his performance status is a lot better. With physical therapy he is now ambulatory. He denies any recent hematuria. He denies any chest pain, shortness breath, abdominal pain, focal weakness. He denies any weight loss, appetite is good.  CT C/A/P 05/05/17 demonstrated stable upper pole right renal mass with imaging features suspicious for renal cell carcinoma, measuring 8.8 x 6.3 cm in the axial plane today. No evidence of metastatic disease in the chest, abdomen or pelvis. LDH was WNL.  (R) radical nephrectomy with [name] on 08/22/17. Pathology revealed clear cell renal cell carcinoma, nuclear grade 3, tumor measuring 8.2 cm and was confined to the kidney; surgical margins negative for carcinoma.  Final stage pT2a pNx.   Current Status: Patient is seen today for follow-up to go over scans.  He started Xarelto this morning.  He reports some minor bruising.  He continues to follow with cardiology regarding his aneurysm.  When questioned he has never undergone colonoscopy and has a brother with colon cancer.  :PATHOLOGY:  (R) radical nephrectomy surgical path: 08/22/17   Problem List Patient Active Problem List   Diagnosis Date Noted  . Iron deficiency anemia [D50.9] 12/01/2017  . Renal cell carcinoma of right kidney ([place]) [C64.1] 09/28/2017  . Abdominal aortic aneurysm (AAA) without rupture ([place]) [I71.4] 06/22/2017  . Atrial fibrillation ([place]) [I48.91] 06/22/2017  . Thyroid nodule [E04.1] 05/13/2017  . Right renal mass [N28.89] 05/04/2017    Past Medical History Past Medical History:  Diagnosis  Date  . AAA (abdominal aortic aneurysm) ([place])   . Abdominal aortic ectasia ([place])   . Atherosclerosis of aorta ([place])   . Atherosclerosis of aorta ([place])   . Atrial fibrillation ([place])   . Benign prostatic hyperplasia   . Cancer [facility])    malignant neoplasm of right kidney per records from [facility]  . Cognitive communication deficit   . Congestive heart failure (CHF) ([place])   . Diverticulitis   . Dysrhythmia    Atrial Fibrillation per [facility] from [facility]  . Hypertension, essential   . Neoplasm of right kidney   . Pneumonia    from records from [facility]  . Thrombocytopenia ([place])   . Vitamin D deficiency     Past Surgical History Past Surgical History:  Procedure Laterality Date  . CATARACT EXTRACTION Bilateral   . LAPAROSCOPIC NEPHRECTOMY Right 08/22/2017   Procedure: LAPAROSCOPIC RADICAL NEPHRECTOMY;  Surgeon: Raynelle Bring, MD;  Location: [facility];  Service: Urology;  Laterality: Right;    Family History History reviewed. No pertinent family history.   Social History  reports that he has quit smoking. He has never used smokeless tobacco.  He reports that he does not drink alcohol or use drugs.  Medications  Current Outpatient Medications:  .  aspirin EC 81 MG tablet, Take 81 mg by mouth daily., Disp: , Rfl:  .  cyanocobalamin (,VITAMIN B-12,) 1000 MCG/ML injection, Inject 1,000 mcg into the muscle every 30 (thirty) days., Disp: , Rfl:  .  furosemide (LASIX) 40 MG tablet, Take 40 mg by mouth daily., Disp: , Rfl:  .  lisinopril (PRINIVIL,ZESTRIL) 20 MG tablet, Take 20 mg by mouth daily., Disp: , Rfl:  .  metoprolol tartrate (LOPRESSOR) 25 MG tablet, Take 0.5 tablets (12.5 mg total) by mouth 2 (two) times daily., Disp: 60 tablet, Rfl: 0 .  pantoprazole (PROTONIX) 40 MG tablet, Take 40 mg by mouth daily., Disp: , Rfl:  .  polyethylene glycol (MIRALAX / GLYCOLAX) packet, Take 17 g by mouth daily., Disp: , Rfl:  .   Rivaroxaban (XARELTO) 15 MG TABS tablet, Take 1 tablet (15 mg total) by mouth daily., Disp: 30 tablet, Rfl: 6 .  tamsulosin (FLOMAX) 0.4 MG CAPS capsule, Take 0.4 mg by mouth daily., Disp: , Rfl:  .  traMADol (ULTRAM) 50 MG tablet, Take 50 mg by mouth every 6 (six) hours as needed., Disp: , Rfl:  .  Amino Acids-Protein Hydrolys (FEEDING SUPPLEMENT, PRO-STAT SUGAR FREE 64,) LIQD, Take 30 mLs by mouth 3 (three) times daily with meals., Disp: , Rfl:  .  atorvastatin (LIPITOR) 20 MG tablet, Take 1 tablet (20 mg total) by mouth daily. (Patient not taking: Reported on 03/16/2018), Disp: 90 tablet, Rfl: 3  Allergies Patient has no known allergies.  Review of Systems Review of Systems - Oncology ROS as per HPI otherwise 12 point ROS is negative other than minor bruising.   Physical Exam  Vitals Wt Readings from Last 3 Encounters:  03/16/18 292 lb (132.5 kg)  03/14/18 296 lb (134.3 kg)  01/02/18 279 lb (126.6 kg)   Temp Readings from Last 3 Encounters:  03/16/18 98 F (36.7 C) (Oral)  12/09/17 97.7 F (36.5 C) (Oral)  11/30/17 97.6 F (36.4 C) (Oral)   BP Readings from Last 3 Encounters:  03/16/18 (!) 145/57  03/14/18 (!) 146/68  01/02/18 (!) 152/80   Pulse Readings from Last 3 Encounters:  03/16/18 (!) 50  03/14/18 (!) 56  01/02/18 65   Constitutional: Well-developed, well-nourished, and in no distress.   HENT: Head: Normocephalic and atraumatic.  Mouth/Throat: No oropharyngeal exudate. Mucosa moist. Eyes: Pupils are equal, round, and reactive to light. Conjunctivae are normal. No scleral icterus.  Neck: Normal range of motion. Neck supple. No JVD present.  Cardiovascular: Normal rate, regular rhythm and normal heart sounds.  Exam reveals no gallop and no friction rub.   No murmur heard. Pulmonary/Chest: Effort normal and breath sounds normal. No respiratory distress. No wheezes.No rales.  Abdominal: Soft. Bowel sounds are normal. No distension. There is no tenderness. There is  no guarding. Healed scar noted on abdomen.   Musculoskeletal: No edema or tenderness.  Lymphadenopathy: No cervical, axillary or supraclavicular adenopathy.  Neurological: Alert and oriented to person, place, and time. No cranial nerve deficit.  Skin: Skin is warm and dry. No rash noted. No erythema. No pallor.  Psychiatric: Affect and judgment normal.   Labs No visits with results within 3 Day(s) from this visit.  Latest known visit with results is:  Appointment on 03/02/2018  Component Date Value Ref Range Status  . WBC 03/02/2018 5.8  4.0 - 10.5 K/uL Final  . RBC 03/02/2018  4.36  4.22 - 5.81 MIL/uL Final  . Hemoglobin 03/02/2018 13.2  13.0 - 17.0 g/dL Final  . HCT 03/02/2018 41.5  39.0 - 52.0 % Final  . MCV 03/02/2018 95.2  78.0 - 100.0 fL Final  . MCH 03/02/2018 30.3  26.0 - 34.0 pg Final  . MCHC 03/02/2018 31.8  30.0 - 36.0 g/dL Final  . RDW 03/02/2018 14.0  11.5 - 15.5 % Final  . Platelets 03/02/2018 200  150 - 400 K/uL Final  . Neutrophils Relative % 03/02/2018 71  % Final  . Neutro Abs 03/02/2018 4.1  1.7 - 7.7 K/uL Final  . Lymphocytes Relative 03/02/2018 16  % Final  . Lymphs Abs 03/02/2018 0.9  0.7 - 4.0 K/uL Final  . Monocytes Relative 03/02/2018 10  % Final  . Monocytes Absolute 03/02/2018 0.6  0.1 - 1.0 K/uL Final  . Eosinophils Relative 03/02/2018 3  % Final  . Eosinophils Absolute 03/02/2018 0.2  0.0 - 0.7 K/uL Final  . Basophils Relative 03/02/2018 0  % Final  . Basophils Absolute 03/02/2018 0.0  0.0 - 0.1 K/uL Final   Performed at [facility], [address], [place], [place] [address]  . Sodium 03/02/2018 136  135 - 145 mmol/L Final  . Potassium 03/02/2018 4.3  3.5 - 5.1 mmol/L Final  . Chloride 03/02/2018 102  101 - 111 mmol/L Final  . CO2 03/02/2018 24  22 - 32 mmol/L Final  . Glucose, Bld 03/02/2018 96  65 - 99 mg/dL Final  . BUN 03/02/2018 32* 6 - 20 mg/dL Final  . Creatinine, Ser 03/02/2018 1.59* 0.61 - 1.24 mg/dL Final  . Calcium 03/02/2018 8.9  8.9 -  10.3 mg/dL Final  . Total Protein 03/02/2018 7.3  6.5 - 8.1 g/dL Final  . Albumin 03/02/2018 3.4* 3.5 - 5.0 g/dL Final  . AST 03/02/2018 13* 15 - 41 U/L Final  . ALT 03/02/2018 11* 17 - 63 U/L Final  . Alkaline Phosphatase 03/02/2018 81  38 - 126 U/L Final  . Total Bilirubin 03/02/2018 0.7  0.3 - 1.2 mg/dL Final  . GFR calc non Af Amer 03/02/2018 40* >60 mL/min Final  . GFR calc Af Amer 03/02/2018 47* >60 mL/min Final   Comment: (NOTE) The eGFR has been calculated using the CKD EPI equation. This calculation has not been validated in all clinical situations. eGFR's persistently <60 mL/min signify possible Chronic Kidney Disease.   Georgiann Hahn gap 03/02/2018 10  5 - 15 Final   Performed at [facility], [address] [place], [place] [address]  . LDH 03/02/2018 123  98 - 192 U/L Final   Performed at [facility], [address] [place], [place] [address]     Pathology Orders Placed This Encounter  Procedures  . CT Chest W Contrast    Standing Status:   Future    Standing Expiration Date:   03/17/2019    Order Specific Question:   If indicated for the ordered procedure, I authorize the administration of contrast media per Radiology protocol    Answer:   Yes    Order Specific Question:   Preferred imaging location?    Answer:   [facility]    Order Specific Question:   Radiology Contrast Protocol - do NOT remove file path    Answer:   [email or web address]  . CT Abdomen Pelvis W Contrast    Standing Status:   Future    Standing Expiration Date:   03/17/2019    Order  Specific Question:   If indicated for the ordered procedure, I authorize the administration of contrast media per Radiology protocol    Answer:   Yes    Order Specific Question:   Preferred imaging location?    Answer:   [facility]    Order Specific Question:   Radiology Contrast Protocol - do NOT remove file path    Answer:   [email or web address]  . CBC with  Differential/Platelet    Standing Status:   Future    Standing Expiration Date:   03/17/2019  . Comprehensive metabolic panel    Standing Status:   Future    Standing Expiration Date:   03/17/2019  . Lactate dehydrogenase    Standing Status:   Future    Standing Expiration Date:   03/17/2019       Zoila Shutter MD

## 2018-05-01 ENCOUNTER — Encounter (HOSPITAL_BASED_OUTPATIENT_CLINIC_OR_DEPARTMENT_OTHER): Payer: Medicare Other | Attending: Internal Medicine

## 2018-05-01 DIAGNOSIS — I87332 Chronic venous hypertension (idiopathic) with ulcer and inflammation of left lower extremity: Secondary | ICD-10-CM | POA: Diagnosis not present

## 2018-05-01 DIAGNOSIS — I890 Lymphedema, not elsewhere classified: Secondary | ICD-10-CM | POA: Diagnosis not present

## 2018-05-01 DIAGNOSIS — I110 Hypertensive heart disease with heart failure: Secondary | ICD-10-CM | POA: Diagnosis not present

## 2018-05-01 DIAGNOSIS — L97221 Non-pressure chronic ulcer of left calf limited to breakdown of skin: Secondary | ICD-10-CM | POA: Insufficient documentation

## 2018-05-01 DIAGNOSIS — I509 Heart failure, unspecified: Secondary | ICD-10-CM | POA: Diagnosis not present

## 2018-05-15 ENCOUNTER — Encounter (HOSPITAL_BASED_OUTPATIENT_CLINIC_OR_DEPARTMENT_OTHER): Payer: Medicare Other | Attending: Internal Medicine

## 2018-05-15 DIAGNOSIS — I87332 Chronic venous hypertension (idiopathic) with ulcer and inflammation of left lower extremity: Secondary | ICD-10-CM | POA: Insufficient documentation

## 2018-05-15 DIAGNOSIS — I110 Hypertensive heart disease with heart failure: Secondary | ICD-10-CM | POA: Diagnosis not present

## 2018-05-15 DIAGNOSIS — L97229 Non-pressure chronic ulcer of left calf with unspecified severity: Secondary | ICD-10-CM | POA: Insufficient documentation

## 2018-05-15 DIAGNOSIS — I890 Lymphedema, not elsewhere classified: Secondary | ICD-10-CM | POA: Insufficient documentation

## 2018-05-15 DIAGNOSIS — L97829 Non-pressure chronic ulcer of other part of left lower leg with unspecified severity: Secondary | ICD-10-CM | POA: Diagnosis not present

## 2018-05-15 DIAGNOSIS — I509 Heart failure, unspecified: Secondary | ICD-10-CM | POA: Diagnosis not present

## 2018-05-29 DIAGNOSIS — I87332 Chronic venous hypertension (idiopathic) with ulcer and inflammation of left lower extremity: Secondary | ICD-10-CM | POA: Diagnosis not present

## 2018-06-12 ENCOUNTER — Encounter (HOSPITAL_BASED_OUTPATIENT_CLINIC_OR_DEPARTMENT_OTHER): Payer: Medicare Other | Attending: Internal Medicine

## 2018-06-12 DIAGNOSIS — Z872 Personal history of diseases of the skin and subcutaneous tissue: Secondary | ICD-10-CM | POA: Diagnosis not present

## 2018-06-12 DIAGNOSIS — Z09 Encounter for follow-up examination after completed treatment for conditions other than malignant neoplasm: Secondary | ICD-10-CM | POA: Diagnosis not present

## 2018-06-21 ENCOUNTER — Ambulatory Visit (HOSPITAL_COMMUNITY)
Admission: RE | Admit: 2018-06-21 | Discharge: 2018-06-21 | Disposition: A | Discharge status: Home / Self Care | Payer: Medicare Other | Source: Ambulatory Visit | Attending: Internal Medicine | Admitting: Internal Medicine

## 2018-06-21 ENCOUNTER — Inpatient Hospital Stay (HOSPITAL_COMMUNITY): Payer: Medicare Other | Attending: Hematology

## 2018-06-21 DIAGNOSIS — Z87891 Personal history of nicotine dependence: Secondary | ICD-10-CM | POA: Diagnosis not present

## 2018-06-21 DIAGNOSIS — N289 Disorder of kidney and ureter, unspecified: Secondary | ICD-10-CM | POA: Insufficient documentation

## 2018-06-21 DIAGNOSIS — D509 Iron deficiency anemia, unspecified: Secondary | ICD-10-CM | POA: Insufficient documentation

## 2018-06-21 DIAGNOSIS — K8020 Calculus of gallbladder without cholecystitis without obstruction: Secondary | ICD-10-CM | POA: Insufficient documentation

## 2018-06-21 DIAGNOSIS — I509 Heart failure, unspecified: Secondary | ICD-10-CM | POA: Diagnosis not present

## 2018-06-21 DIAGNOSIS — Z7982 Long term (current) use of aspirin: Secondary | ICD-10-CM | POA: Insufficient documentation

## 2018-06-21 DIAGNOSIS — I712 Thoracic aortic aneurysm, without rupture: Secondary | ICD-10-CM | POA: Insufficient documentation

## 2018-06-21 DIAGNOSIS — R918 Other nonspecific abnormal finding of lung field: Secondary | ICD-10-CM | POA: Diagnosis not present

## 2018-06-21 DIAGNOSIS — Z79899 Other long term (current) drug therapy: Secondary | ICD-10-CM | POA: Insufficient documentation

## 2018-06-21 DIAGNOSIS — I517 Cardiomegaly: Secondary | ICD-10-CM | POA: Insufficient documentation

## 2018-06-21 DIAGNOSIS — I4891 Unspecified atrial fibrillation: Secondary | ICD-10-CM | POA: Diagnosis not present

## 2018-06-21 DIAGNOSIS — Z800 Family history of malignant neoplasm of digestive organs: Secondary | ICD-10-CM | POA: Diagnosis not present

## 2018-06-21 DIAGNOSIS — K7689 Other specified diseases of liver: Secondary | ICD-10-CM | POA: Insufficient documentation

## 2018-06-21 DIAGNOSIS — K5732 Diverticulitis of large intestine without perforation or abscess without bleeding: Secondary | ICD-10-CM | POA: Insufficient documentation

## 2018-06-21 DIAGNOSIS — D179 Benign lipomatous neoplasm, unspecified: Secondary | ICD-10-CM | POA: Diagnosis not present

## 2018-06-21 DIAGNOSIS — C641 Malignant neoplasm of right kidney, except renal pelvis: Secondary | ICD-10-CM | POA: Insufficient documentation

## 2018-06-21 DIAGNOSIS — E041 Nontoxic single thyroid nodule: Secondary | ICD-10-CM | POA: Insufficient documentation

## 2018-06-21 DIAGNOSIS — K429 Umbilical hernia without obstruction or gangrene: Secondary | ICD-10-CM | POA: Insufficient documentation

## 2018-06-21 DIAGNOSIS — I2510 Atherosclerotic heart disease of native coronary artery without angina pectoris: Secondary | ICD-10-CM | POA: Insufficient documentation

## 2018-06-21 DIAGNOSIS — M5135 Other intervertebral disc degeneration, thoracolumbar region: Secondary | ICD-10-CM | POA: Diagnosis not present

## 2018-06-21 DIAGNOSIS — I714 Abdominal aortic aneurysm, without rupture: Secondary | ICD-10-CM | POA: Insufficient documentation

## 2018-06-21 DIAGNOSIS — N400 Enlarged prostate without lower urinary tract symptoms: Secondary | ICD-10-CM | POA: Diagnosis not present

## 2018-06-21 DIAGNOSIS — I130 Hypertensive heart and chronic kidney disease with heart failure and stage 1 through stage 4 chronic kidney disease, or unspecified chronic kidney disease: Secondary | ICD-10-CM | POA: Diagnosis not present

## 2018-06-21 DIAGNOSIS — M47815 Spondylosis without myelopathy or radiculopathy, thoracolumbar region: Secondary | ICD-10-CM | POA: Insufficient documentation

## 2018-06-21 DIAGNOSIS — I700 Atherosclerosis of aorta: Secondary | ICD-10-CM | POA: Insufficient documentation

## 2018-06-21 DIAGNOSIS — E559 Vitamin D deficiency, unspecified: Secondary | ICD-10-CM | POA: Diagnosis not present

## 2018-06-21 LAB — CBC WITH DIFFERENTIAL/PLATELET
Basophils Absolute: 0 10*3/uL (ref 0.0–0.1)
Basophils Relative: 0 %
EOS PCT: 5 %
Eosinophils Absolute: 0.3 10*3/uL (ref 0.0–0.7)
HCT: 42.1 % (ref 39.0–52.0)
Hemoglobin: 13.8 g/dL (ref 13.0–17.0)
LYMPHS ABS: 1 10*3/uL (ref 0.7–4.0)
LYMPHS PCT: 14 %
MCH: 31 pg (ref 26.0–34.0)
MCHC: 32.8 g/dL (ref 30.0–36.0)
MCV: 94.6 fL (ref 78.0–100.0)
MONO ABS: 0.7 10*3/uL (ref 0.1–1.0)
Monocytes Relative: 10 %
Neutro Abs: 5.1 10*3/uL (ref 1.7–7.7)
Neutrophils Relative %: 71 %
PLATELETS: 172 10*3/uL (ref 150–400)
RBC: 4.45 MIL/uL (ref 4.22–5.81)
RDW: 14 % (ref 11.5–15.5)
WBC: 7 10*3/uL (ref 4.0–10.5)

## 2018-06-21 LAB — COMPREHENSIVE METABOLIC PANEL
ALT: 14 U/L (ref 0–44)
AST: 16 U/L (ref 15–41)
Albumin: 3.6 g/dL (ref 3.5–5.0)
Alkaline Phosphatase: 92 U/L (ref 38–126)
Anion gap: 9 (ref 5–15)
BUN: 40 mg/dL — ABNORMAL HIGH (ref 8–23)
CALCIUM: 8.6 mg/dL — AB (ref 8.9–10.3)
CHLORIDE: 106 mmol/L (ref 98–111)
CO2: 21 mmol/L — ABNORMAL LOW (ref 22–32)
Creatinine, Ser: 1.67 mg/dL — ABNORMAL HIGH (ref 0.61–1.24)
GFR, EST AFRICAN AMERICAN: 44 mL/min — AB (ref >60)
GFR, EST NON AFRICAN AMERICAN: 38 mL/min — AB (ref >60)
Glucose, Bld: 101 mg/dL — ABNORMAL HIGH (ref 70–99)
Potassium: 4.3 mmol/L (ref 3.5–5.1)
Sodium: 136 mmol/L (ref 135–145)
TOTAL PROTEIN: 7.6 g/dL (ref 6.5–8.1)
Total Bilirubin: 0.6 mg/dL (ref 0.3–1.2)

## 2018-06-21 LAB — LACTATE DEHYDROGENASE: LDH: 124 U/L (ref 98–192)

## 2018-06-21 MED ORDER — IOHEXOL 300 MG/ML  SOLN
75.0000 mL | Freq: Once | INTRAMUSCULAR | Status: AC | PRN
  Administered 2018-06-21: 75 mL via INTRAVENOUS

## 2018-06-26 ENCOUNTER — Inpatient Hospital Stay (HOSPITAL_BASED_OUTPATIENT_CLINIC_OR_DEPARTMENT_OTHER): Payer: Medicare Other | Admitting: Internal Medicine

## 2018-06-26 ENCOUNTER — Other Ambulatory Visit: Payer: Self-pay

## 2018-06-26 ENCOUNTER — Encounter (HOSPITAL_COMMUNITY): Payer: Self-pay | Admitting: Internal Medicine

## 2018-06-26 VITALS — BP 146/55 | HR 58 | Temp 98.3°F | Resp 18 | Wt 293.0 lb

## 2018-06-26 DIAGNOSIS — E559 Vitamin D deficiency, unspecified: Secondary | ICD-10-CM

## 2018-06-26 DIAGNOSIS — I712 Thoracic aortic aneurysm, without rupture: Secondary | ICD-10-CM | POA: Diagnosis not present

## 2018-06-26 DIAGNOSIS — Z7982 Long term (current) use of aspirin: Secondary | ICD-10-CM

## 2018-06-26 DIAGNOSIS — D509 Iron deficiency anemia, unspecified: Secondary | ICD-10-CM

## 2018-06-26 DIAGNOSIS — K5732 Diverticulitis of large intestine without perforation or abscess without bleeding: Secondary | ICD-10-CM | POA: Diagnosis not present

## 2018-06-26 DIAGNOSIS — K8020 Calculus of gallbladder without cholecystitis without obstruction: Secondary | ICD-10-CM

## 2018-06-26 DIAGNOSIS — I2510 Atherosclerotic heart disease of native coronary artery without angina pectoris: Secondary | ICD-10-CM

## 2018-06-26 DIAGNOSIS — N400 Enlarged prostate without lower urinary tract symptoms: Secondary | ICD-10-CM

## 2018-06-26 DIAGNOSIS — I130 Hypertensive heart and chronic kidney disease with heart failure and stage 1 through stage 4 chronic kidney disease, or unspecified chronic kidney disease: Secondary | ICD-10-CM

## 2018-06-26 DIAGNOSIS — C641 Malignant neoplasm of right kidney, except renal pelvis: Secondary | ICD-10-CM

## 2018-06-26 DIAGNOSIS — I4891 Unspecified atrial fibrillation: Secondary | ICD-10-CM

## 2018-06-26 DIAGNOSIS — E041 Nontoxic single thyroid nodule: Secondary | ICD-10-CM

## 2018-06-26 DIAGNOSIS — Z79899 Other long term (current) drug therapy: Secondary | ICD-10-CM

## 2018-06-26 DIAGNOSIS — I714 Abdominal aortic aneurysm, without rupture: Secondary | ICD-10-CM | POA: Diagnosis not present

## 2018-06-26 DIAGNOSIS — R918 Other nonspecific abnormal finding of lung field: Secondary | ICD-10-CM

## 2018-06-26 DIAGNOSIS — K7689 Other specified diseases of liver: Secondary | ICD-10-CM

## 2018-06-26 DIAGNOSIS — Z87891 Personal history of nicotine dependence: Secondary | ICD-10-CM

## 2018-06-26 DIAGNOSIS — Z800 Family history of malignant neoplasm of digestive organs: Secondary | ICD-10-CM

## 2018-06-26 DIAGNOSIS — I509 Heart failure, unspecified: Secondary | ICD-10-CM

## 2018-06-26 NOTE — Progress Notes (Signed)
Diagnosis Renal cell carcinoma of right kidney (Vincent Pope) - Plan: CBC with Differential/Platelet, Comprehensive metabolic panel, Lactate dehydrogenase, CT CHEST W CONTRAST, CT Abdomen Pelvis W Contrast  Staging Cancer Staging No matching staging information was found for the patient.  Assessment and Plan: 1.  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  CT CAP done 06/21/2018 reviewed and shows   IMPRESSION: 1. No findings of recurrent malignancy. 2. A nonspecific 1.0 cm hypodense lesion of the left kidney lower pole is observed. Due to its small size and motion artifact this was difficult to characterize on the prior MRI from 01/10/2017, and overall is nonspecific although statistically is most likely to represent a cyst. 3. Mild acute sigmoid colon diverticulitis. Mild diffuse wall thickening in the distal sigmoid colon and rectum, potentially incidental or due to low-grade inflammation. 4. Infrarenal abdominal aortic aneurysm, 4.2 cm in diameter. Recommend followup by ultrasound in 1 year. This recommendation follows ACR consensus guidelines: [name] on Vascular Findings. [name] 2013; 10:789-794. 5. 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 Recommend annual imaging followup by CTA or MRA. This recommendation follows 2010 ACCF/AHA/AATS/ACR/ASA/SCA/SCAI/SIR/STS/SVM Guidelines for the Diagnosis and Management of Patients with Thoracic Aortic Disease. Circulation. 2010; 121: e266-e369 6. Large right thyroid nodule. This has been previously biopsied on 10/04/2017. 7. Other imaging findings of potential clinical significance: Mild cardiomegaly. Thoracolumbar spondylosis and degenerative disc disease. Numerous hepatic cysts along with a known hepatic hemangioma in segment 7 of the liver. Small duodenal lipoma. Aortic Atherosclerosis  (ICD10-I70.0). Mild prostatomegaly. Small umbilical hernia containing adipose tissue. Cholelithiasis. Coronary Atherosclerosis.  Labs done 06/21/2018 showed WBC 7, HB 13.8 plts 172,000.  Cr 1.67.  Pt will RTC in 10/2018 for follow-up with repeat labs and scans.  He should notify the office if he has any problems prior to his next visit.   2.  Abdominal aortic aneurysm.  He is followed by vascular surgery.  Aneurysm measured 4.2 cm and is stable on scans done 06/21/2018.    3.  Atrial fibrillation.  Pt remains on Xarelto.  Denies any bleeding.  Follow-up with cardiology as recommended.  4.  Family history of colon cancer.  He reports this occurred in a brother.  The patient has never undergone colonoscopy.  I have again discussed with him GI referral especially due to evidence of diverticulitis that was noted on scan done 06/2018.  He should also discuss this further with PCP.    5.  CKD  Cr is stable at 1.67.  Will repeat labs in 10/2018.    6  IDA  HB stable at 13.8.  Will repeat labs in 10/2018.    7.  Thyroid nodule.  This was noted on CT scan done 06/2018.  Pt had biopsy done 10/05/2018 with pathology returning as a benign follicular nodule.  If changes noted on subsequent imaging will refer.     Interval history:   (From [name] note on 06/20/17)  Vincent Pope 77 y.o. male is here because of right renal mass suspicious for renal cell carcinoma. He is currently staying at [facility] and rehabilitation.  His most recent scans demonstrate:  CT abdomen and pelvis with contrast 01/09/17 demonstrated a 8.6 x 6.4 cm complex solid/cystic mass within the superior pole of the right kidney, previously 6.7 x 5.2 cm. Smaller bilateral low attenuation renal lesions  are stable when compared to prior exam. Cysts within the left hepatic lobe is increased in size measuring up to 7.4 cm, previously 5.5 cm.  Chest x-ray 01/09/17 demonstrated a low lung volumes with basilar opacities primary  and to represent atelectasis.  MRI abdomen with and without contrast 01/10/17 which demonstrated a heterogeneously enhancing mass in the right kidney, 6.2 x 8.7 x 6 cm, the lesion extends up to the renal vein but does not appear to extend into the renal vein at this time. This lesion remains in capsulated with [name], no lymphadenopathy. He also has multiple small renal cysts as well as numerous liver cysts as well.  Patient had gone for surgical evaluation by [name] back on 02/01/17 and was deemed not to be a surgical candidate due to his weakened state. Patient had just arrived at the skilled nursing facility for rehabilitation in February 2018. At that time he could not stand up or get out of his wheelchair and spent most of his time in bed. Per [name] know at that time, patient had abdominal CT dating back to April 2011 which revealed a 5.4 cm indeterminate cystic focus. Follow-up CT scan in October 2015 revealed a complex cystic mass measuring 6.5 x 5.2 x 4.5 cm in the right upper pole. 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  Currently patient states that his performance status is a lot better. With physical therapy he is now ambulatory. He denies any recent hematuria. He denies any chest pain, shortness breath, abdominal pain, focal weakness. He denies any weight loss, appetite is good.  CT C/A/P 05/05/17 demonstrated stable upper pole right renal mass with imaging features suspicious for renal cell carcinoma, measuring 8.8 x 6.3 cm in the axial plane today. No evidence of metastatic disease in the chest, abdomen or pelvis. LDH was WNL.  (R) radical nephrectomy with [name] on 08/22/17. Pathology revealed clear cell renal cell carcinoma, nuclear grade 3, tumor measuring 8.2 cm and was confined to the kidney; surgical margins negative for carcinoma.  Final stage pT2a pNx.  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.  Patient had a right kidney mass reportedly noted on outside imaging from 12/2016. Repeat CT imaging on 05/05/17 revealed right renal mass measuring 8.8 cm; no evidence of metastatic disease on imaging.  He was referred to urology for surgical consideration initially with [name] per patient.  On 08/22/17, patient underwent right radical nephrectomy with [name] with surgical pathology revealing renal cell carcinoma, clear cell histology, with disease confined to kidney (T2a).    Current Status: Patient is seen today for follow-up to go over scans and labs.    :PATHOLOGY:  (R) radical nephrectomy surgical path: 08/22/17    Problem List Patient Active Problem List   Diagnosis Date Noted  . Iron deficiency anemia [D50.9] 12/01/2017  . Renal cell carcinoma of right kidney ([place]) [C64.1] 09/28/2017  . Abdominal aortic aneurysm (AAA) without rupture ([place]) [I71.4] 06/22/2017  . Atrial fibrillation ([place]) [I48.91] 06/22/2017  . Thyroid nodule [E04.1] 05/13/2017  . Right renal mass [N28.89] 05/04/2017    Past Medical History Past Medical History:  Diagnosis Date  . AAA (abdominal aortic aneurysm) ([place])   . Abdominal aortic ectasia ([place])   . Atherosclerosis of aorta ([place])   . Atherosclerosis of aorta ([place])   . Atrial  fibrillation ([place])   . Benign prostatic hyperplasia   . Cancer [facility])    malignant neoplasm of right kidney per records from [facility]  . Cognitive communication deficit   . Congestive heart failure (CHF) ([place])   . Diverticulitis   . Dysrhythmia    Atrial Fibrillation per records from [facility]  . Hypertension, essential   . Neoplasm of right kidney   . Pneumonia    from records from [facility]  . Thrombocytopenia ([place])   . Vitamin D deficiency     Past Surgical History Past Surgical History:    Procedure Laterality Date  . CATARACT EXTRACTION Bilateral   . LAPAROSCOPIC NEPHRECTOMY Right 08/22/2017   Procedure: LAPAROSCOPIC RADICAL NEPHRECTOMY;  Surgeon: Raynelle Bring, MD;  Location: [facility]S;  Service: Urology;  Laterality: Right;    Family History History reviewed. No pertinent family history.   Social History  reports that he has quit smoking. He has never used smokeless tobacco. He reports that he does not drink alcohol or use drugs.  Medications  Current Outpatient Medications:  .  Amino Acids-Protein Hydrolys (FEEDING SUPPLEMENT, PRO-STAT SUGAR FREE 64,) LIQD, Take 30 mLs by mouth 3 (three) times daily with meals., Disp: , Rfl:  .  aspirin EC 81 MG tablet, Take 81 mg by mouth daily., Disp: , Rfl:  .  cyanocobalamin (,VITAMIN B-12,) 1000 MCG/ML injection, Inject 1,000 mcg into the muscle every 30 (thirty) days., Disp: , Rfl:  .  furosemide (LASIX) 40 MG tablet, Take 40 mg by mouth daily., Disp: , Rfl:  .  lisinopril (PRINIVIL,ZESTRIL) 20 MG tablet, Take 20 mg by mouth daily., Disp: , Rfl:  .  metoprolol tartrate (LOPRESSOR) 25 MG tablet, Take 0.5 tablets (12.5 mg total) by mouth 2 (two) times daily., Disp: 60 tablet, Rfl: 0 .  pantoprazole (PROTONIX) 40 MG tablet, Take 40 mg by mouth daily., Disp: , Rfl:  .  polyethylene glycol (MIRALAX / GLYCOLAX) packet, Take 17 g by mouth daily., Disp: , Rfl:  .  Rivaroxaban (XARELTO) 15 MG TABS tablet, Take 1 tablet (15 mg total) by mouth daily., Disp: 30 tablet, Rfl: 6 .  tamsulosin (FLOMAX) 0.4 MG CAPS capsule, Take 0.4 mg by mouth daily., Disp: , Rfl:  .  traMADol (ULTRAM) 50 MG tablet, Take 50 mg by mouth every 6 (six) hours as needed., Disp: , Rfl:  .  atorvastatin (LIPITOR) 20 MG tablet, Take 1 tablet (20 mg total) by mouth daily. (Patient not taking: Reported on 03/16/2018), Disp: 90 tablet, Rfl: 3  Allergies Patient has no known allergies.  Review of Systems Review of Systems - Oncology ROS negative   Physical  Exam  Vitals Wt Readings from Last 3 Encounters:  06/26/18 293 lb (132.9 kg)  03/16/18 292 lb (132.5 kg)  03/14/18 296 lb (134.3 kg)   Temp Readings from Last 3 Encounters:  06/26/18 98.3 F (36.8 C) (Oral)  03/16/18 98 F (36.7 C) (Oral)  12/09/17 97.7 F (36.5 C) (Oral)   BP Readings from Last 3 Encounters:  06/26/18 (!) 146/55  03/16/18 (!) 145/57  03/14/18 (!) 146/68   Pulse Readings from Last 3 Encounters:  06/26/18 (!) 58  03/16/18 (!) 50  03/14/18 (!) 56   Constitutional: Well-developed, well-nourished, and in no distress.   HENT: Head: Normocephalic and atraumatic.  Mouth/Throat: No oropharyngeal exudate. Mucosa moist. Eyes: Pupils are equal, round, and reactive to light. Conjunctivae are normal. No scleral icterus.  Neck: Normal range  of motion. Neck supple. No JVD present.  Cardiovascular: Normal rate, regular rhythm and normal heart sounds.  Exam reveals no gallop and no friction rub.   No murmur heard. Pulmonary/Chest: Effort normal and breath sounds normal. No respiratory distress. No wheezes.No rales.  Abdominal: Soft. Bowel sounds are normal. No distension. There is no tenderness. There is no guarding.  Musculoskeletal: No edema or tenderness.  Lymphadenopathy: No cervical, axillary or supraclavicular adenopathy.  Neurological: Alert and oriented to person, place, and time. No cranial nerve deficit.  Skin: Skin is warm and dry. No rash noted. No erythema. No pallor.  Psychiatric: Affect and judgment normal.   Labs No visits with results within 3 Day(s) from this visit.  Latest known visit with results is:  Appointment on 06/21/2018  Component Date Value Ref Range Status  . WBC 06/21/2018 7.0  4.0 - 10.5 K/uL Final  . RBC 06/21/2018 4.45  4.22 - 5.81 MIL/uL Final  . Hemoglobin 06/21/2018 13.8  13.0 - 17.0 g/dL Final  . HCT 06/21/2018 42.1  39.0 - 52.0 % Final  . MCV 06/21/2018 94.6  78.0 - 100.0 fL Final  . MCH 06/21/2018 31.0  26.0 - 34.0 pg Final  .  MCHC 06/21/2018 32.8  30.0 - 36.0 g/dL Final  . RDW 06/21/2018 14.0  11.5 - 15.5 % Final  . Platelets 06/21/2018 172  150 - 400 K/uL Final  . Neutrophils Relative % 06/21/2018 71  % Final  . Neutro Abs 06/21/2018 5.1  1.7 - 7.7 K/uL Final  . Lymphocytes Relative 06/21/2018 14  % Final  . Lymphs Abs 06/21/2018 1.0  0.7 - 4.0 K/uL Final  . Monocytes Relative 06/21/2018 10  % Final  . Monocytes Absolute 06/21/2018 0.7  0.1 - 1.0 K/uL Final  . Eosinophils Relative 06/21/2018 5  % Final  . Eosinophils Absolute 06/21/2018 0.3  0.0 - 0.7 K/uL Final  . Basophils Relative 06/21/2018 0  % Final  . Basophils Absolute 06/21/2018 0.0  0.0 - 0.1 K/uL Final   Performed at [facility], [address], [place], [place] [address]  . Sodium 06/21/2018 136  135 - 145 mmol/L Final  . Potassium 06/21/2018 4.3  3.5 - 5.1 mmol/L Final  . Chloride 06/21/2018 106  98 - 111 mmol/L Final   Please note change in reference range.  . CO2 06/21/2018 21* 22 - 32 mmol/L Final  . Glucose, Bld 06/21/2018 101* 70 - 99 mg/dL Final   Please note change in reference range.  . BUN 06/21/2018 40* 8 - 23 mg/dL Final   Please note change in reference range.  . Creatinine, Ser 06/21/2018 1.67* 0.61 - 1.24 mg/dL Final  . Calcium 06/21/2018 8.6* 8.9 - 10.3 mg/dL Final  . Total Protein 06/21/2018 7.6  6.5 - 8.1 g/dL Final  . Albumin 06/21/2018 3.6  3.5 - 5.0 g/dL Final  . AST 06/21/2018 16  15 - 41 U/L Final  . ALT 06/21/2018 14  0 - 44 U/L Final   Please note change in reference range.  . Alkaline Phosphatase 06/21/2018 92  38 - 126 U/L Final  . Total Bilirubin 06/21/2018 0.6  0.3 - 1.2 mg/dL Final  . GFR calc non Af Amer 06/21/2018 38* >60 mL/min Final  . GFR calc Af Amer 06/21/2018 44* >60 mL/min Final   Comment: (NOTE) The eGFR has been calculated using the CKD EPI equation. This calculation has not been validated in all clinical situations. eGFR's persistently <60 mL/min signify possible Chronic Kidney  Disease.   Georgiann Hahn gap 06/21/2018 9  5 - 15 Final   Performed at [facility], [address] [place], [place] [address]  . LDH 06/21/2018 124  98 - 192 U/L Final   Performed at [facility], [address], [place], [place] [address]     Pathology Orders Placed This Encounter  Procedures  . CT CHEST W CONTRAST    Standing Status:   Future    Standing Expiration Date:   06/26/2019    Order Specific Question:   If indicated for the ordered procedure, I authorize the administration of contrast media per Radiology protocol    Answer:   Yes    Order Specific Question:   Preferred imaging location?    Answer:   [facility]    Order Specific Question:   Radiology Contrast Protocol - do NOT remove file path    Answer:   [email or web address]  . CT Abdomen Pelvis W Contrast    Standing Status:   Future    Standing Expiration Date:   06/26/2019    Order Specific Question:   If indicated for the ordered procedure, I authorize the administration of contrast media per Radiology protocol    Answer:   Yes    Order Specific Question:   Preferred imaging location?    Answer:   [facility]    Order Specific Question:   Is Oral Contrast requested for this exam?    Answer:   Yes, Per Radiology protocol    Order Specific Question:   Radiology Contrast Protocol - do NOT remove file path    Answer:   \\[email or web address]  . CBC with Differential/Platelet    Standing Status:   Future    Standing Expiration Date:   06/27/2019  . Comprehensive metabolic panel    Standing Status:   Future    Standing Expiration Date:   06/27/2019  . Lactate dehydrogenase    Standing Status:   Future    Standing Expiration Date:   06/27/2019       Zoila Shutter MD

## 2018-06-26 NOTE — Patient Instructions (Signed)
[facility] at [facility] Discharge Instructions  Today you saw Dr. Higgs.    Thank you for choosing [facility] at [facility] to provide your oncology and hematology care.  To afford each patient quality time with our provider, please arrive at least 15 minutes before your scheduled appointment time.   If you have a lab appointment with the Cancer Center please come in thru the  Main Entrance and check in at the main information desk  You need to re-schedule your appointment should you arrive 10 or more minutes late.  We strive to give you quality time with our providers, and arriving late affects you and other patients whose appointments are after yours.  Also, if you no show three or more times for appointments you may be dismissed from the clinic at the providers discretion.     Again, thank you for choosing [facility].  Our hope is that these requests will decrease the amount of time that you wait before being seen by our physicians.       _____________________________________________________________  Should you have questions after your visit to  Cancer Center, please contact our office at [phone] between the hours of 8:30 a.m. and 4:30 p.m.  Voicemails left after 4:30 p.m. will not be returned until the following business day.  For prescription refill requests, have your pharmacy contact our office.       Resources For Cancer Patients and their Caregivers ? American Cancer Society: Can assist with transportation, wigs, general needs, runs Look Good Feel Better.        1-888-227-6333 ? Cancer Care: Provides financial assistance, online support groups, medication/co-pay assistance.  1-800-813-HOPE (4673) ? [facility] Assists [place] cancer patients and their families through emotional , educational and financial support.  336-427-4357 ? [place] DSS Where to apply for  food stamps, Medicaid and utility assistance. 336-342-1394 ? [facility]: Transportation to medical appointments. 336-347-2287 ? Social Security Administration: May apply for disability if have a Stage IV cancer. 336-342-7796 1-800-772-1213 ? [place] Aging, Disability and Transit Services: Assists with nutrition, care and transit needs. 336-349-2343  Cancer Center Support Programs:   > Cancer Support Group  2nd Tuesday of the month 1pm-2pm, Journey Room   > Creative Journey  3rd Tuesday of the month 1130am-1pm, Journey Room    

## 2018-07-10 ENCOUNTER — Ambulatory Visit (INDEPENDENT_AMBULATORY_CARE_PROVIDER_SITE_OTHER): Payer: Medicare Other | Admitting: Family

## 2018-07-10 ENCOUNTER — Ambulatory Visit (HOSPITAL_COMMUNITY)
Admission: RE | Admit: 2018-07-10 | Discharge: 2018-07-10 | Disposition: A | Discharge status: Home / Self Care | Payer: Medicare Other | Source: Ambulatory Visit | Attending: Family | Admitting: Family

## 2018-07-10 ENCOUNTER — Encounter: Payer: Self-pay | Admitting: Family

## 2018-07-10 VITALS — BP 153/77 | HR 57 | Temp 97.6°F | Resp 18 | Ht 73.0 in | Wt 292.0 lb

## 2018-07-10 DIAGNOSIS — I714 Abdominal aortic aneurysm, without rupture, unspecified: Secondary | ICD-10-CM

## 2018-07-10 DIAGNOSIS — R0989 Other specified symptoms and signs involving the circulatory and respiratory systems: Secondary | ICD-10-CM | POA: Diagnosis not present

## 2018-07-10 NOTE — Progress Notes (Signed)
[facility] [place]   CC: Follow up Abdominal Aortic Aneurysm  History of Present Illness  Vincent Pope is a 77 y.o. (15-Mar-1941) male who was referred to Dr. Trula Slade for evaluation of a abdominal aortic aneurysm that was discovered on a CT scan for a complex renal mass on the right kidney.  Initial evaluation was on 12-14-17.  Patient has a history of atrial fibrillation, he takes Xarelto.  He is medically managed for hypertension.  He has a history of congestive heart failure.  Patient complains of bilateral lower extremity edema.  He has now wounds in his lower extremities. He is wearing knee high compression hose.   At the 01-02-18 visit, maximum diameter of AAA by CT scan was 4.5 cm.  Dr. Trula Slade  discussed with the patient that he would recommend repair once the aneurysm becomes 5.0-5.5 cm in size, will continue to monitor him every 6 months with ultrasound. He discussed that he would need another CT angiogram once the aneurysm becomes large enough in order to evaluate him for a stent graft.  He will also need carotid Dopplers and ABIs with lower extremity duplex prior to any endovascular repair.  Pt is a [facility].   Pt states his legs are weak, as the reason he is in a w/c, states he can stand and walk with a cane. He does not know why his legs are weak.  He states the year is 2019.   He denies back pain, denies abdominal pain.    The patient denies claudication type symptoms in legs with walking , no pain, just weakness. The patient denies history of stroke or TIA symptoms.  Diabetic: No Tobacco use: former smoker, quit in the 1980's  Past Medical History:  Diagnosis Date  . AAA (abdominal aortic aneurysm) ([place])   . Abdominal aortic ectasia ([place])   . Atherosclerosis of aorta ([place])   . Atherosclerosis of aorta ([place])   . Atrial fibrillation ([place])   . Benign prostatic hyperplasia   . Cancer [facility])    malignant neoplasm of  right kidney per records from [facility]  . Cognitive communication deficit   . Congestive heart failure (CHF) ([place])   . Diverticulitis   . Dysrhythmia    Atrial Fibrillation per [facility]  . Hypertension, essential   . Neoplasm of right kidney   . Pneumonia    from records from [facility]  . Thrombocytopenia ([place])   . Vitamin D deficiency    Past Surgical History:  Procedure Laterality Date  . CATARACT EXTRACTION Bilateral   . LAPAROSCOPIC NEPHRECTOMY Right 08/22/2017   Procedure: LAPAROSCOPIC RADICAL NEPHRECTOMY;  Surgeon: Raynelle Bring, MD;  Location: [facility];  Service: Urology;  Laterality: Right;   Social History Social History   Socioeconomic History  . Marital status: Single    Spouse name: Not on file  . Number of children: Not on file  . Years of education: Not on file  . Highest education level: Not on file  Occupational History  . Not on file  Social Needs  . Financial resource strain: Not on file  . Food insecurity:    Worry: Not on file    Inability: Not on file  . Transportation needs:    Medical: Not on file    Non-medical: Not on file  Tobacco Use  . Smoking status: Former Research scientist (life sciences)  . Smokeless tobacco: Never Used  Substance and  Sexual Activity  . Alcohol use: No  . Drug use: No  . Sexual activity: Not Currently  Lifestyle  . Physical activity:    Days per week: Not on file    Minutes per session: Not on file  . Stress: Not on file  Relationships  . Social connections:    Talks on phone: Not on file    Gets together: Not on file    Attends religious service: Not on file    Active member of club or organization: Not on file    Attends meetings of clubs or organizations: Not on file    Relationship status: Not on file  . Intimate partner violence:    Fear of current or ex partner: Not on file    Emotionally abused: Not on file    Physically abused: Not on file    Forced  sexual activity: Not on file  Other Topics Concern  . Not on file  Social History Narrative  . Not on file   Family History History reviewed. No pertinent family history.  Current Outpatient Medications on File Prior to Visit  Medication Sig Dispense Refill  . Amino Acids-Protein Hydrolys (FEEDING SUPPLEMENT, PRO-STAT SUGAR FREE 64,) LIQD Take 30 mLs by mouth 3 (three) times daily with meals.    Marland Kitchen aspirin EC 81 MG tablet Take 81 mg by mouth daily.    . cyanocobalamin (,VITAMIN B-12,) 1000 MCG/ML injection Inject 1,000 mcg into the muscle every 30 (thirty) days.    . furosemide (LASIX) 40 MG tablet Take 40 mg by mouth daily.    Marland Kitchen lisinopril (PRINIVIL,ZESTRIL) 20 MG tablet Take 20 mg by mouth daily.    . metoprolol tartrate (LOPRESSOR) 25 MG tablet Take 0.5 tablets (12.5 mg total) by mouth 2 (two) times daily. 60 tablet 0  . pantoprazole (PROTONIX) 40 MG tablet Take 40 mg by mouth daily.    . polyethylene glycol (MIRALAX / GLYCOLAX) packet Take 17 g by mouth daily.    . Rivaroxaban (XARELTO) 15 MG TABS tablet Take 1 tablet (15 mg total) by mouth daily. 30 tablet 6  . tamsulosin (FLOMAX) 0.4 MG CAPS capsule Take 0.4 mg by mouth daily.    . traMADol (ULTRAM) 50 MG tablet Take 50 mg by mouth every 6 (six) hours as needed.    Marland Kitchen atorvastatin (LIPITOR) 20 MG tablet Take 1 tablet (20 mg total) by mouth daily. (Patient not taking: Reported on 03/16/2018) 90 tablet 3   No current facility-administered medications on file prior to visit.    No Known Allergies  ROS: See HPI for pertinent positives and negatives.  Physical Examination  Vitals:   07/10/18 0953  BP: (!) 153/77  Pulse: (!) 57  Resp: 18  Temp: 97.6 F (36.4 C)  TempSrc: Oral  SpO2: 99%  Weight: 292 lb (132.5 kg)  Height: 6\' 1"  (1.854 m)   Body mass index is 38.52 kg/m.  General: A&O x 3, WD, obese male. HEENT: Grossly intact and WNL.  Pulmonary: Sym exp, respirations are non labored, good air movt, CTAB, no rales,  rhonchi, or wheezing. Cardiac: Irregular rhythm, bradycardic (on a beta blocker), no detected murmur.  Carotid Bruits Right Left   Negative Positive   Adominal aortic pulse is not palpable Radial pulses are 2+ palpable                          VASCULAR EXAM:  LE Pulses Right Left       FEMORAL  not palpable (obese., seated in w/c)  not palpable        POPLITEAL  not palpable   not palpable       POSTERIOR TIBIAL  not palpable   not palpable        DORSALIS PEDIS      ANTERIOR TIBIAL not palpable  not palpable     Gastrointestinal: soft, NTND, -G/R, - HSM, - masses palpated, - CVAT B. Musculoskeletal: M/S 5/5 throughout, Extremities without ischemic changes.Knee high compression hose in place, open toed hose show toes are pink and warm. 1+ bilateral pretibial pitting edema.  Skin: No rashes, no ulcers, no cellulitis.   Neurologic: CN 2-12 intact, Pain and light touch intact in extremities are intact, Motor exam as listed above. Psychiatric: Normal thought content, mood appropriate to clinical situation.    DATA  AAA Duplex (07/10/2018):   Previous size: (Date: 06-21-18) CTA abd/pelvis with contrast ordered by Dr. Walden Field, follow up right renal cell carcinoma.  Vascular/Lymphatic: Aortoiliac atherosclerotic vascular disease. Bilobed infrarenal abdominal aortic aneurysm with upper portion 4.1 cm anterior-posterior and lower portion 4.0 cm, both shown on image 125/6. 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   Current size: 4.8 cm (Date: 07-10-18); Right CIA: 1.2 cm; Left CIA: 1.2 cm. Limited visualization due to overlying bowel gas and obesity.   Medical Decision Making  The patient is a 77 y.o. male who presents with asymptomatic AAA with increase in size to 4.8 cm based on limited visualization. CTA abd/pelvis with contrast just 19 days earlier showed 4.1 cm AAA.    Based on this  patient's exam and diagnostic studies, the patient will follow up in 6 months with the following studies: AAA duplex, carotid duplex, ABI's, and bilateral lower extremity arterial duplex.   Consideration for repair of AAA would be made when the size is 5.0 cm, growth > 1 cm/yr, and symptomatic status.        The patient was given information about AAA including signs, symptoms, treatment, and how to minimize the risk of enlargement and rupture of aneurysms.    I emphasized the importance of maximal medical management including strict control of blood pressure, blood glucose, and lipid levels, antiplatelet agents, obtaining regular exercise, and continued cessation of smoking.   The patient was advised to call 911 should the patient experience sudden onset abdominal or back pain.   Thank you for allowing [place] to participate in this patient's care.  Clemon Chambers, RN, MSN, FNP-C [facility] [place] Office: [facility] Physician: Oneida Alar  07/10/2018, 10:10 AM

## 2018-07-10 NOTE — Patient Instructions (Addendum)
Before your next abdominal ultrasound:  Avoid gas forming foods and beverages the day before the test.   Take two Extra-Strength Gas-X capsules at bedtime the night before the test. Take another two Extra-Strength Gas-X capsules in the middle of the night if you get up to the restroom, if not, first thing in the morning with water.  Do not chew gum.      Abdominal Aortic Aneurysm Blood pumps away from the heart through tubes (blood vessels) called arteries. Aneurysms are weak or damaged places in the wall of an artery. It bulges out like a balloon. An abdominal aortic aneurysm happens in the main artery of the body (aorta). It can burst or tear, causing bleeding inside the body. This is an emergency. It needs treatment right away. What are the causes? The exact cause is unknown. Things that could cause this problem include:  Fat and other substances building up in the lining of a tube.  Swelling of the walls of a blood vessel.  Certain tissue diseases.  Belly (abdominal) trauma.  An infection in the main artery of the body.  What increases the risk? There are things that make it more likely for you to have an aneurysm. These include:  Being over the age of 78 years old.  Having high blood pressure (hypertension).  Being a male.  Being white.  Being very overweight (obese).  Having a family history of aneurysm.  Using tobacco products.  What are the signs or symptoms? Symptoms depend on the size of the aneurysm and how fast it grows. There may not be symptoms. If symptoms occur, they can include:  Pain (belly, side, lower back, or groin).  Feeling full after eating a small amount of food.  Feeling sick to your stomach (nauseous), throwing up (vomiting), or both.  Feeling a lump in your belly that feels like it is beating (pulsating).  Feeling like you will pass out (faint).  How is this treated?  Medicine to control blood pressure and pain.  Imaging tests to  see if the aneurysm gets bigger.  Surgery. How is this prevented? To lessen your chance of getting this condition:  Stop smoking. Stop chewing tobacco.  Limit or avoid alcohol.  Keep your blood pressure, blood sugar, and cholesterol within normal limits.  Eat less salt.  Eat foods low in saturated fats and cholesterol. These are found in animal and whole dairy products.  Eat more fiber. Fiber is found in whole grains, vegetables, and fruits.  Keep a healthy weight.  Stay active and exercise often.  This information is not intended to replace advice given to you by your health care provider. Make sure you discuss any questions you have with your health care provider. Document Released: 03/26/2013 Document Revised: 05/06/2016 Document Reviewed: 12/29/2012 Elsevier Interactive Patient Education  2017 Elsevier Inc.  

## 2018-07-20 ENCOUNTER — Other Ambulatory Visit: Payer: Self-pay

## 2018-07-20 DIAGNOSIS — I714 Abdominal aortic aneurysm, without rupture, unspecified: Secondary | ICD-10-CM

## 2018-07-20 DIAGNOSIS — R0989 Other specified symptoms and signs involving the circulatory and respiratory systems: Secondary | ICD-10-CM

## 2018-09-20 ENCOUNTER — Other Ambulatory Visit (HOSPITAL_COMMUNITY)
Admission: RE | Admit: 2018-09-20 | Discharge: 2018-09-20 | Disposition: A | Discharge status: Home / Self Care | Payer: Medicare Other | Source: Ambulatory Visit | Attending: Physician Assistant | Admitting: Physician Assistant

## 2018-09-20 ENCOUNTER — Encounter: Payer: Self-pay | Admitting: Physician Assistant

## 2018-09-20 ENCOUNTER — Ambulatory Visit (INDEPENDENT_AMBULATORY_CARE_PROVIDER_SITE_OTHER): Payer: Medicare Other | Admitting: Physician Assistant

## 2018-09-20 VITALS — BP 132/60 | HR 54 | Ht 72.0 in | Wt 310.0 lb

## 2018-09-20 DIAGNOSIS — N183 Chronic kidney disease, stage 3 unspecified: Secondary | ICD-10-CM

## 2018-09-20 DIAGNOSIS — Z79899 Other long term (current) drug therapy: Secondary | ICD-10-CM

## 2018-09-20 DIAGNOSIS — I5032 Chronic diastolic (congestive) heart failure: Secondary | ICD-10-CM

## 2018-09-20 DIAGNOSIS — I4821 Permanent atrial fibrillation: Secondary | ICD-10-CM

## 2018-09-20 DIAGNOSIS — R5381 Other malaise: Secondary | ICD-10-CM

## 2018-09-20 DIAGNOSIS — I2510 Atherosclerotic heart disease of native coronary artery without angina pectoris: Secondary | ICD-10-CM

## 2018-09-20 LAB — HEPATIC FUNCTION PANEL
ALT: 13 U/L (ref 0–44)
AST: 15 U/L (ref 15–41)
Albumin: 3.5 g/dL (ref 3.5–5.0)
Alkaline Phosphatase: 82 U/L (ref 38–126)
Bilirubin, Direct: 0.1 mg/dL (ref 0.0–0.2)
Indirect Bilirubin: 0.6 mg/dL (ref 0.3–0.9)
TOTAL PROTEIN: 7.1 g/dL (ref 6.5–8.1)
Total Bilirubin: 0.7 mg/dL (ref 0.3–1.2)

## 2018-09-20 LAB — LIPID PANEL
Cholesterol: 113 mg/dL (ref 0–200)
HDL: 39 mg/dL — ABNORMAL LOW (ref >40)
LDL Cholesterol: 61 mg/dL (ref 0–99)
Total CHOL/HDL Ratio: 2.9 RATIO
Triglycerides: 66 mg/dL (ref <150)
VLDL: 13 mg/dL (ref 0–40)

## 2018-09-20 NOTE — Progress Notes (Addendum)
Cardiology Office Note   Date:  09/20/2018   ID:  Doryan Bahl, DOB November 21, 1941, MRN [id number]  PCP:  Hilbert Corrigan, MD Cardiologist:  Kate Sable, MD 03/14/2018 Rosaria Ferries, PA-C    History of Present Illness: Vincent Pope is a 77 y.o. male with a history of 4.8 cm AAA (Dr Oneida Alar) repair when > 5 cm, perm Afib, no anticoag 2nd hematuria, R radial nephrectomy 08/2017, int MV 06/2017 w/ inf scar and peri-infarct ischemia, medical therapy, BPH, D-CHF, HTN, thrombocytopenia  4/2 office visit, heart rate controlled on low-dose metoprolol, hematuria resolved so Xarelto 15 mg daily started, Lipitor 20 mg started, no aspirin because of Xarelto 07/29 [facility] office visit, repeat US in 6 months.   [name] presents for cardiology follow up.  He is at [facility] in [place] for rehab. He was getting stronger, but is no longer working w/ rehab. He admits he has gotten slack w/ ambulation, is not doing the walking and strengthening exercises like he should.   No chest pain, with or without exertion.  LE edema is chronic, no recent change. Compression socks help the edema, but he does not wear them daily. L leg always swells more than the R.    His breathing is at baseline.  He gets short of breath with activity, but this is not new and has not gotten worse recently.  He has chronic orthopnea, no recent change.  He denies PND.  He walks with a cane or a walker. Really needs the walker. Feels he can walk a block with the walker.   He snores, but has never had a sleep test.   No bleeding issues on the Xarelto.   No presyncope or syncope.    Past Medical History:  Diagnosis Date  . AAA (abdominal aortic aneurysm) ([place])   . Abdominal aortic ectasia ([place])   . Atherosclerosis of aorta ([place])   . Atherosclerosis of aorta ()   . Atrial fibrillation ([place])   . Benign prostatic hyperplasia   . Cancer [facility])    malignant neoplasm of right kidney per records from  [facility]  . Cognitive communication deficit   . Congestive heart failure (CHF) ([place])   . Diverticulitis   . Dysrhythmia    Atrial Fibrillation per records from [facility]  . Hypertension, essential   . Neoplasm of right kidney   . Pneumonia    from records from [facility]  . Thrombocytopenia ([place])   . Vitamin D deficiency     Past Surgical History:  Procedure Laterality Date  . CATARACT EXTRACTION Bilateral   . LAPAROSCOPIC NEPHRECTOMY Right 08/22/2017   Procedure: LAPAROSCOPIC RADICAL NEPHRECTOMY;  Surgeon: Raynelle Bring, MD;  Location: [facility];  Service: Urology;  Laterality: Right;    Current Outpatient Medications  Medication Sig Dispense Refill  . Amino Acids-Protein Hydrolys (FEEDING SUPPLEMENT, PRO-STAT SUGAR FREE 64,) LIQD Take 30 mLs by mouth 3 (three) times daily with meals.    Marland Kitchen atorvastatin (LIPITOR) 20 MG tablet Take 1 tablet (20 mg total) by mouth daily. 90 tablet 3  . cyanocobalamin (,VITAMIN B-12,) 1000 MCG/ML injection Inject 1,000 mcg into the muscle every 30 (thirty) days.    . furosemide (LASIX) 40 MG tablet Take 40 mg by mouth daily.    Marland Kitchen lisinopril (PRINIVIL,ZESTRIL) 20 MG tablet Take 20 mg by mouth daily.    . metoprolol tartrate (LOPRESSOR) 25 MG tablet Take 0.5 tablets (12.5 mg total) by mouth  2 (two) times daily. 60 tablet 0  . polyethylene glycol (MIRALAX / GLYCOLAX) packet Take 17 g by mouth daily.    . Rivaroxaban (XARELTO) 15 MG TABS tablet Take 1 tablet (15 mg total) by mouth daily. 30 tablet 6  . tamsulosin (FLOMAX) 0.4 MG CAPS capsule Take 0.4 mg by mouth daily.     No current facility-administered medications for this visit.     Allergies:   Patient has no known allergies.    Social History:  The patient  reports that he has quit smoking. He has never used smokeless tobacco. He reports that he does not drink alcohol or use drugs.   Family History:  The patient's family history includes  Cancer in his brother.    ROS:  Please see the history of present illness. All other systems are reviewed and negative.    PHYSICAL EXAM: VS:  BP 132/60   Pulse (!) 54   Ht 6' (1.829 m)   Wt (!) 310 lb (140.6 kg)   SpO2 96%   BMI 42.04 kg/m  , BMI Body mass index is 42.04 kg/m. GEN: Well nourished, morbidly obese, male in no acute distress HEENT: normal for age  Neck: no JVD, no carotid bruit, no masses Cardiac: Irreg R&R; soft murmur, no rubs, or gallops Respiratory: decreased BS bases but clear to auscultation bilaterally, normal work of breathing GI: soft, nontender, nondistended, + BS MS: no deformity or atrophy; L>R LE 1+ edema; distal pulses are 2+ in upper extremities, difficult to palpate in lower extrem but cap refill ok Skin: warm and dry, no rash, chronic changes noted to LLE Neuro:  Strength and sensation are intact Psych: euthymic mood, full affect   EKG:  EKG is ordered today. The ekg ordered today demonstrates atrial fib, diffuse low voltage, no sig change from 08/2017  MYOVIEW: 06/29/2017  Atrial fibrillation seen throughout study.  Defect 1: There is a medium defect of moderate severity present in the basal inferoseptal, basal inferior, mid inferoseptal, mid inferior and apical inferior location.  Findings consistent with prior myocardial infarction with peri-infarct ischemia with additional variable soft tissue attenuation artifact.  This is an intermediate risk study.  Recent Labs: 06/21/2018: ALT 14; BUN 40; Creatinine, Ser 1.67; Hemoglobin 13.8; Platelets 172; Potassium 4.3; Sodium 136    Lipid Panel No results found for: CHOL, TRIG, HDL, CHOLHDL, VLDL, LDLCALC, LDLDIRECT   Wt Readings from Last 3 Encounters:  09/20/18 (!) 310 lb (140.6 kg)  07/10/18 292 lb (132.5 kg)  06/26/18 293 lb (132.9 kg)     Other studies Reviewed: Additional studies/ records that were reviewed today include: office notes, hospital records, records from [facility],  testing.  ASSESSMENT AND PLAN:  1. CAD, no angina: no CP w/ exertion - continue BB, ACE, statin - no ASA 2nd Xarelto - no testing unless he develops symptoms  2. Chronic diastolic CHF:  - says does not wake w/ LE edema, gets it during the day. - weight is up some, may be secondary to LE edema and some actual weight gain - continue Lasix 40 mg qd -Ambulate with assistance, if opposed to sats dropped, may need more Lasix - Compression socks daily  3. Perm A fib: - rate is ok - HR low at times, but no presyncope or syncope, continue low-dose BB  4. Chronic anticoag - CHA2DS2-VASc = 5 (age x 2, CAD, CHF, HTN) - H&H 12.6/37.1 on 09/23 (at facility) - no bleeding issues, continue Xarelto  5. CKD  III - recent labs at facility w/ BUN/Cr 50/1.73 - slightly higher than previous, recheck in 1 month, need to make sure they are not continuing to trend up  6.  Deconditioning: - He needs to ambulate with assistance daily, I believe this will encourage him to do more. - Have encouraged him to walk in addition on his own.  7.  Hyperlipidemia: -He is on a statin.  Recent BMET results are reviewed. - No lipid profile is in the computer or on the records sent from [place] liver functions and lipids today  Current medicines are reviewed at length with the patient today.  The patient does not have concerns regarding medicines.  The following changes have been made:  no change  Labs/ tests ordered today include:   Orders Placed This Encounter  Procedures  . Hepatic function panel  . Lipid Profile  . EKG 12-Lead     Disposition:   FU with Kate Sable, MD  Signed, Rosaria Ferries, PA-C  09/20/2018 2:05 PM    [place] Phone: [phone]; Fax: [phone]  This note was written with the assistance of speech recognition software.  Please excuse any transcriptional errors.

## 2018-09-20 NOTE — Patient Instructions (Signed)
Medication Instructions:  Your physician recommends that you continue on your current medications as directed. Please refer to the Current Medication list given to you today.  If you need a refill on your cardiac medications before your next appointment, please call your pharmacy.   Lab work: Your physician recommends that you return for lab work in: today   If you have labs (blood work) drawn today and your tests are completely normal, you will receive your results only by: Marland Kitchen MyChart Message (if you have MyChart) OR . A paper copy in the mail If you have any lab test that is abnormal or we need to change your treatment, we will call you to review the results.  Testing/Procedures: NONE   Follow-Up: At [facility], you and your health needs are our priority.  As part of our continuing mission to provide you with exceptional heart care, we have created designated Provider Care Teams.  These Care Teams include your primary Cardiologist (physician) and Advanced Practice Providers (APPs -  Physician Assistants and Nurse Practitioners) who all work together to provide you with the care you need, when you need it. You will need a follow up appointment in 6 months.  Please call our office 2 months in advance to schedule this appointment.  You may see Kate Sable, MD or one of the following Advanced Practice Providers on your designated Care Team:   Bernerd Pho, PA-C [facility]) . Ermalinda Barrios, PA-C ([place])  Any Other Special Instructions Will Be Listed Below (If Applicable). Thank you for choosing [facility]!

## 2018-09-22 ENCOUNTER — Other Ambulatory Visit: Payer: Self-pay | Admitting: Physician Assistant

## 2018-09-22 MED ORDER — ATORVASTATIN CALCIUM 20 MG PO TABS: 20.0000 mg | ORAL_TABLET | Freq: Every day | ORAL | 3 refills | Status: DC

## 2018-10-10 ENCOUNTER — Encounter (HOSPITAL_COMMUNITY): Payer: Medicare Other

## 2018-10-10 ENCOUNTER — Other Ambulatory Visit (HOSPITAL_COMMUNITY): Payer: Medicare Other

## 2018-10-11 ENCOUNTER — Inpatient Hospital Stay (HOSPITAL_COMMUNITY): Payer: Medicare Other | Attending: Hematology

## 2018-10-11 DIAGNOSIS — C641 Malignant neoplasm of right kidney, except renal pelvis: Secondary | ICD-10-CM

## 2018-10-11 DIAGNOSIS — C642 Malignant neoplasm of left kidney, except renal pelvis: Secondary | ICD-10-CM | POA: Diagnosis not present

## 2018-10-11 LAB — COMPREHENSIVE METABOLIC PANEL
ALBUMIN: 3.6 g/dL (ref 3.5–5.0)
ALK PHOS: 85 U/L (ref 38–126)
ALT: 14 U/L (ref 0–44)
ANION GAP: 8 (ref 5–15)
AST: 15 U/L (ref 15–41)
BILIRUBIN TOTAL: 0.9 mg/dL (ref 0.3–1.2)
BUN: 42 mg/dL — AB (ref 8–23)
CALCIUM: 8.6 mg/dL — AB (ref 8.9–10.3)
CO2: 22 mmol/L (ref 22–32)
CREATININE: 1.71 mg/dL — AB (ref 0.61–1.24)
Chloride: 108 mmol/L (ref 98–111)
GFR calc Af Amer: 42 mL/min — ABNORMAL LOW (ref >60)
GFR calc non Af Amer: 37 mL/min — ABNORMAL LOW (ref >60)
GLUCOSE: 98 mg/dL (ref 70–99)
Potassium: 4.3 mmol/L (ref 3.5–5.1)
Sodium: 138 mmol/L (ref 135–145)
TOTAL PROTEIN: 7.4 g/dL (ref 6.5–8.1)

## 2018-10-11 LAB — CBC WITH DIFFERENTIAL/PLATELET
ABS IMMATURE GRANULOCYTES: 0.03 10*3/uL (ref 0.00–0.07)
Basophils Absolute: 0 10*3/uL (ref 0.0–0.1)
Basophils Relative: 1 %
Eosinophils Absolute: 0.4 10*3/uL (ref 0.0–0.5)
Eosinophils Relative: 6 %
HEMATOCRIT: 44.4 % (ref 39.0–52.0)
HEMOGLOBIN: 13.8 g/dL (ref 13.0–17.0)
Immature Granulocytes: 1 %
LYMPHS ABS: 0.9 10*3/uL (ref 0.7–4.0)
LYMPHS PCT: 14 %
MCH: 30.5 pg (ref 26.0–34.0)
MCHC: 31.1 g/dL (ref 30.0–36.0)
MCV: 98 fL (ref 80.0–100.0)
MONO ABS: 0.5 10*3/uL (ref 0.1–1.0)
Monocytes Relative: 8 %
NEUTROS ABS: 4.6 10*3/uL (ref 1.7–7.7)
Neutrophils Relative %: 70 %
Platelets: 147 10*3/uL — ABNORMAL LOW (ref 150–400)
RBC: 4.53 MIL/uL (ref 4.22–5.81)
RDW: 13.9 % (ref 11.5–15.5)
WBC: 6.4 10*3/uL (ref 4.0–10.5)
nRBC: 0 % (ref 0.0–0.2)

## 2018-10-11 LAB — FERRITIN: Ferritin: 40 ng/mL (ref 24–336)

## 2018-10-11 LAB — LACTATE DEHYDROGENASE: LDH: 137 U/L (ref 98–192)

## 2018-10-12 ENCOUNTER — Ambulatory Visit: Payer: Medicare Other | Admitting: Family

## 2018-10-12 ENCOUNTER — Encounter (HOSPITAL_COMMUNITY): Payer: Medicare Other

## 2018-10-16 ENCOUNTER — Ambulatory Visit (HOSPITAL_COMMUNITY)
Admission: RE | Admit: 2018-10-16 | Discharge: 2018-10-16 | Disposition: A | Discharge status: Home / Self Care | Payer: Medicare Other | Source: Ambulatory Visit | Attending: Internal Medicine | Admitting: Internal Medicine

## 2018-10-16 DIAGNOSIS — K8020 Calculus of gallbladder without cholecystitis without obstruction: Secondary | ICD-10-CM | POA: Diagnosis not present

## 2018-10-16 DIAGNOSIS — C641 Malignant neoplasm of right kidney, except renal pelvis: Secondary | ICD-10-CM | POA: Diagnosis not present

## 2018-10-16 DIAGNOSIS — I714 Abdominal aortic aneurysm, without rupture: Secondary | ICD-10-CM | POA: Insufficient documentation

## 2018-10-16 DIAGNOSIS — E041 Nontoxic single thyroid nodule: Secondary | ICD-10-CM | POA: Insufficient documentation

## 2018-10-16 MED ORDER — IOPAMIDOL (ISOVUE-300) INJECTION 61%
100.0000 mL | Freq: Once | INTRAVENOUS | Status: AC | PRN
  Administered 2018-10-16: 100 mL via INTRAVENOUS

## 2018-10-20 ENCOUNTER — Inpatient Hospital Stay (HOSPITAL_COMMUNITY): Payer: Medicare Other | Attending: Hematology | Admitting: Internal Medicine

## 2018-10-20 ENCOUNTER — Encounter (HOSPITAL_COMMUNITY): Payer: Self-pay | Admitting: Internal Medicine

## 2018-10-20 VITALS — BP 162/52 | HR 50 | Temp 97.8°F | Resp 16 | Ht 74.0 in | Wt 302.3 lb

## 2018-10-20 DIAGNOSIS — Z905 Acquired absence of kidney: Secondary | ICD-10-CM | POA: Insufficient documentation

## 2018-10-20 DIAGNOSIS — Z800 Family history of malignant neoplasm of digestive organs: Secondary | ICD-10-CM | POA: Insufficient documentation

## 2018-10-20 DIAGNOSIS — C641 Malignant neoplasm of right kidney, except renal pelvis: Secondary | ICD-10-CM | POA: Insufficient documentation

## 2018-10-20 DIAGNOSIS — I4891 Unspecified atrial fibrillation: Secondary | ICD-10-CM | POA: Insufficient documentation

## 2018-10-20 DIAGNOSIS — N189 Chronic kidney disease, unspecified: Secondary | ICD-10-CM

## 2018-10-20 DIAGNOSIS — E041 Nontoxic single thyroid nodule: Secondary | ICD-10-CM | POA: Diagnosis not present

## 2018-10-20 DIAGNOSIS — I714 Abdominal aortic aneurysm, without rupture: Secondary | ICD-10-CM | POA: Diagnosis not present

## 2018-10-20 DIAGNOSIS — Z87891 Personal history of nicotine dependence: Secondary | ICD-10-CM | POA: Diagnosis not present

## 2018-10-20 DIAGNOSIS — I10 Essential (primary) hypertension: Secondary | ICD-10-CM | POA: Diagnosis not present

## 2018-10-20 DIAGNOSIS — Z7901 Long term (current) use of anticoagulants: Secondary | ICD-10-CM | POA: Diagnosis not present

## 2018-10-20 DIAGNOSIS — Z79899 Other long term (current) drug therapy: Secondary | ICD-10-CM | POA: Diagnosis not present

## 2018-10-20 NOTE — Progress Notes (Signed)
Diagnosis Renal cell carcinoma of right kidney (Vincent Pope) - Plan: CBC with Differential/Platelet, Comprehensive metabolic panel, Lactate dehydrogenase, CT CHEST W CONTRAST, CT ABDOMEN PELVIS W CONTRAST  Staging Cancer Staging No matching staging information was found for the patient.  Assessment and Plan:  1.  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     CT CAP done 10/16/2018 reviewed and showed  IMPRESSION: 1. Stable exam. No new or progressive interval findings. Specifically, no findings to suggest local recurrence or metastatic disease in this patient with a history of right renal cell carcinoma. 2. Stable appearance of the 10 mm hypodense lesion lower pole left kidney with no change in the exophytic interpolar left renal lesion, likely a cyst. 3. 4.1 cm infrarenal abdominal aortic aneurysm. Recommend followup by [place] in 1 year. This recommendation follows ACR consensus guidelines: [name] on Vascular Findings. [name] 2013; 10:789-794. 4. Cholelithiasis. 5. Stable appearance exophytic large right thyroid nodule.  Labs done 10/11/2018 reviewed and showed WBC 6.4 HB 13.8 plts 147,000.  Chemistries WNL with K+ 4.3 Cr 1.71 and normal LFTs.  Ferritin 40.   Pt will be set up for CT CAP in 04/2019 for ongoing follow-up of left kidney lesion felt likely to be a cyst.  He will be seen in 04/2019 for follow-up and labs.    2.  Abdominal aortic aneurysm.  He is followed by vascular surgery.  Aneurysm measured 4.1 cm and is stable on scans done 10/16/2018  3.  Atrial fibrillation.  Pt remains on Xarelto.  Denies any bleeding.  Follow-up with cardiology as recommended.  4.  Family history of colon cancer.  He reports this occurred in a brother.  The patient has never undergone colonoscopy.  I have again discussed with him GI referral especially due to evidence of  diverticulitis that was noted on scan done 06/2018.  He should also discuss this further with PCP.  He does not desire referral at the present time.    5.  CKD  Cr is stable at 171. Repeat chemistries in 04/2019.    6  IDA  HB stable at 13.8.  Ferritin WNL at 40.    7.  Thyroid nodule.  This was noted on CT scan done 06/2018.  Pt had biopsy done 10/05/2018 with pathology returning as a benign follicular nodule.  If changes noted on subsequent imaging will refer for surgical evaluation.  Findings stable on recent CT scan.   25 minutes spent with more than 50% spent in counseling and coordination of care.     Interval history:   (From [name] note on 06/20/17)  Vincent Pope 77 y.o. male is here because of right renal mass suspicious for renal cell carcinoma. He is currently staying at [name].  His most recent scans demonstrate:  CT abdomen and pelvis with contrast 01/09/17 demonstrated a 8.6 x 6.4 cm complex solid/cystic mass within the superior pole of the right kidney, previously 6.7 x 5.2 cm. Smaller bilateral low attenuation renal lesions are stable when compared to prior exam. Cysts within the left hepatic lobe is increased in size measuring up to 7.4 cm, previously 5.5 cm.  Chest x-ray 01/09/17 demonstrated a low lung volumes with basilar opacities primary and to represent atelectasis.  MRI abdomen with and without contrast 01/10/17 which demonstrated a heterogeneously enhancing mass in the  right kidney, 6.2 x 8.7 x 6 cm, the lesion extends up to the renal vein but does not appear to extend into the renal vein at this time. This lesion remains in capsulated with [name], no lymphadenopathy. He also has multiple small renal cysts as well as numerous liver cysts as well.  Patient had gone for surgical evaluation by [name] back on 02/01/17 and was deemed not to be a surgical candidate due to his weakened state. Patient had just  arrived at the skilled nursing facility for rehabilitation in February 2018. At that time he could not stand up or get out of his wheelchair and spent most of his time in bed. Per [name] know at that time, patient had abdominal CT dating back to April 2011 which revealed a 5.4 cm indeterminate cystic focus. Follow-up CT scan in October 2015 revealed a complex cystic mass measuring 6.5 x 5.2 x 4.5 cm in the right upper pole. 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  Currently patient states that his performance status is a lot better. With physical therapy he is now ambulatory. He denies any recent hematuria. He denies any chest pain, shortness breath, abdominal pain, focal weakness. He denies any weight loss, appetite is good.  CT C/A/P 05/05/17 demonstrated stable upper pole right renal mass with imaging features suspicious for renal cell carcinoma, measuring 8.8 x 6.3 cm in the axial plane today. No evidence of metastatic disease in the chest, abdomen or pelvis. LDH was WNL.  (R) radical nephrectomy with [name] on 08/22/17. Pathology revealed clear cell renal cell carcinoma, nuclear grade 3, tumor measuring 8.2 cm and was confined to the kidney; surgical margins negative for carcinoma.  Final stage pT2a pNx.  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.  Patient had a right kidney mass reportedly noted on outside imaging from 12/2016. Repeat CT imaging on 05/05/17 revealed right renal mass measuring 8.8 cm; no evidence of metastatic disease on imaging.  He was referred to urology for surgical consideration initially with [name] per patient.  On 08/22/17, patient underwent right radical nephrectomy with [name] with surgical pathology revealing renal cell carcinoma, clear cell histology, with disease confined to kidney (T2a).    Current  Status: Patient is seen today for follow-up to go over scans and labs.    :PATHOLOGY:  (R) radical nephrectomy surgical path: 08/22/17      Problem List Patient Active Problem List   Diagnosis Date Noted  . Iron deficiency anemia [D50.9] 12/01/2017  . Renal cell carcinoma of right kidney ([place]) [C64.1] 09/28/2017  . Abdominal aortic aneurysm (AAA) without rupture ([place]) [I71.4] 06/22/2017  . Atrial fibrillation ([place]) [I48.91] 06/22/2017  . Thyroid nodule [E04.1] 05/13/2017  . Right renal mass [N28.89] 05/04/2017    Past Medical History Past Medical History:  Diagnosis Date  . AAA (abdominal aortic aneurysm) ([place])   . Abdominal aortic ectasia ([place])   . Atherosclerosis of aorta ([place])   . Atherosclerosis of aorta ([place])   . Atrial fibrillation ([place])   . Benign prostatic hyperplasia   . Cancer [facility])    malignant neoplasm of right kidney per records from [facility]  . Cognitive communication deficit   . Congestive heart failure (CHF) ([place])   . Diverticulitis   . Dysrhythmia    Atrial Fibrillation per records  from [facility]  . Hypertension, essential   . Neoplasm of right kidney   . Pneumonia    from records from [facility]  . Thrombocytopenia ([place])   . Vitamin D deficiency     Past Surgical History Past Surgical History:  Procedure Laterality Date  . CATARACT EXTRACTION Bilateral   . LAPAROSCOPIC NEPHRECTOMY Right 08/22/2017   Procedure: LAPAROSCOPIC RADICAL NEPHRECTOMY;  Surgeon: Raynelle Bring, MD;  Location: [facility];  Service: Urology;  Laterality: Right;    Family History Family History  Problem Relation Age of Onset  . Cancer Brother      Social History  reports that he has quit smoking. He has never used smokeless tobacco. He reports that he does not drink alcohol or use drugs.  Medications  Current Outpatient Medications:  .  Amino Acids-Protein Hydrolys (FEEDING SUPPLEMENT, PRO-STAT SUGAR FREE 64,)  LIQD, Take 30 mLs by mouth 3 (three) times daily with meals., Disp: , Rfl:  .  atorvastatin (LIPITOR) 20 MG tablet, Take 1 tablet (20 mg total) by mouth daily., Disp: 90 tablet, Rfl: 3 .  cyanocobalamin (,VITAMIN B-12,) 1000 MCG/ML injection, Inject 1,000 mcg into the muscle every 30 (thirty) days., Disp: , Rfl:  .  furosemide (LASIX) 40 MG tablet, Take 40 mg by mouth daily., Disp: , Rfl:  .  lisinopril (PRINIVIL,ZESTRIL) 20 MG tablet, Take 20 mg by mouth daily., Disp: , Rfl:  .  metoprolol tartrate (LOPRESSOR) 25 MG tablet, Take 0.5 tablets (12.5 mg total) by mouth 2 (two) times daily., Disp: 60 tablet, Rfl: 0 .  polyethylene glycol (MIRALAX / GLYCOLAX) packet, Take 17 g by mouth daily., Disp: , Rfl:  .  Rivaroxaban (XARELTO) 15 MG TABS tablet, Take 1 tablet (15 mg total) by mouth daily., Disp: 30 tablet, Rfl: 6 .  tamsulosin (FLOMAX) 0.4 MG CAPS capsule, Take 0.4 mg by mouth daily., Disp: , Rfl:   Allergies Patient has no known allergies.  Review of Systems Review of Systems - Oncology ROS negative   Physical Exam  Vitals Wt Readings from Last 3 Encounters:  10/20/18 (!) 302 lb 5 oz (137.1 kg)  09/20/18 (!) 310 lb (140.6 kg)  07/10/18 292 lb (132.5 kg)   Temp Readings from Last 3 Encounters:  10/20/18 97.8 F (36.6 C) (Oral)  07/10/18 97.6 F (36.4 C) (Oral)  06/26/18 98.3 F (36.8 C) (Oral)   BP Readings from Last 3 Encounters:  10/20/18 (!) 162/52  09/20/18 132/60  07/10/18 (!) 153/77   Pulse Readings from Last 3 Encounters:  10/20/18 (!) 50  09/20/18 (!) 54  07/10/18 (!) 57   Constitutional: Well-developed, well-nourished, and in no distress.  Seated in wheelchair. HENT: Head: Normocephalic and atraumatic.  Mouth/Throat: No oropharyngeal exudate. Mucosa moist. Eyes: Pupils are equal, round, and reactive to light. Conjunctivae are normal. No scleral icterus.  Neck: Normal range of motion. Neck supple. No JVD present.  Cardiovascular: Normal rate, regular rhythm  and normal heart sounds.  Exam reveals no gallop and no friction rub.   No murmur heard. Pulmonary/Chest: Effort normal and breath sounds normal. No respiratory distress. No wheezes.No rales.  Abdominal: Soft. Bowel sounds are normal. No distension. There is no tenderness. There is no guarding.  Musculoskeletal: No edema or tenderness.  Lymphadenopathy: No cervical, axillary or supraclavicular adenopathy.  Neurological: Alert and oriented to person, place, and time. No cranial nerve deficit.  Skin: Skin is warm and dry. No rash noted. No erythema. No pallor.  Psychiatric:  Affect and judgment normal.   Labs No visits with results within 3 Day(s) from this visit.  Latest known visit with results is:  Appointment on 10/11/2018  Component Date Value Ref Range Status  . WBC 10/11/2018 6.4  4.0 - 10.5 K/uL Final  . RBC 10/11/2018 4.53  4.22 - 5.81 MIL/uL Final  . Hemoglobin 10/11/2018 13.8  13.0 - 17.0 g/dL Final  . HCT 10/11/2018 44.4  39.0 - 52.0 % Final  . MCV 10/11/2018 98.0  80.0 - 100.0 fL Final  . MCH 10/11/2018 30.5  26.0 - 34.0 pg Final  . MCHC 10/11/2018 31.1  30.0 - 36.0 g/dL Final  . RDW 10/11/2018 13.9  11.5 - 15.5 % Final  . Platelets 10/11/2018 147* 150 - 400 K/uL Final   SPECIMEN CHECKED FOR CLOTS  . nRBC 10/11/2018 0.0  0.0 - 0.2 % Final  . Neutrophils Relative % 10/11/2018 70  % Final  . Neutro Abs 10/11/2018 4.6  1.7 - 7.7 K/uL Final  . Lymphocytes Relative 10/11/2018 14  % Final  . Lymphs Abs 10/11/2018 0.9  0.7 - 4.0 K/uL Final  . Monocytes Relative 10/11/2018 8  % Final  . Monocytes Absolute 10/11/2018 0.5  0.1 - 1.0 K/uL Final  . Eosinophils Relative 10/11/2018 6  % Final  . Eosinophils Absolute 10/11/2018 0.4  0.0 - 0.5 K/uL Final  . Basophils Relative 10/11/2018 1  % Final  . Basophils Absolute 10/11/2018 0.0  0.0 - 0.1 K/uL Final  . Immature Granulocytes 10/11/2018 1  % Final  . Abs Immature Granulocytes 10/11/2018 0.03  0.00 - 0.07 K/uL Final   Performed at  [facility], [address], [place], [place] [address]  . Sodium 10/11/2018 138  135 - 145 mmol/L Final  . Potassium 10/11/2018 4.3  3.5 - 5.1 mmol/L Final  . Chloride 10/11/2018 108  98 - 111 mmol/L Final  . CO2 10/11/2018 22  22 - 32 mmol/L Final  . Glucose, Bld 10/11/2018 98  70 - 99 mg/dL Final  . BUN 10/11/2018 42* 8 - 23 mg/dL Final  . Creatinine, Ser 10/11/2018 1.71* 0.61 - 1.24 mg/dL Final  . Calcium 10/11/2018 8.6* 8.9 - 10.3 mg/dL Final  . Total Protein 10/11/2018 7.4  6.5 - 8.1 g/dL Final  . Albumin 10/11/2018 3.6  3.5 - 5.0 g/dL Final  . AST 10/11/2018 15  15 - 41 U/L Final  . ALT 10/11/2018 14  0 - 44 U/L Final  . Alkaline Phosphatase 10/11/2018 85  38 - 126 U/L Final  . Total Bilirubin 10/11/2018 0.9  0.3 - 1.2 mg/dL Final  . GFR calc non Af Amer 10/11/2018 37* >60 mL/min Final  . GFR calc Af Amer 10/11/2018 42* >60 mL/min Final   Comment: (NOTE) The eGFR has been calculated using the CKD EPI equation. This calculation has not been validated in all clinical situations. eGFR's persistently <60 mL/min signify possible Chronic Kidney Disease.   Georgiann Hahn gap 10/11/2018 8  5 - 15 Final   Performed at [facility], [address], [place], [place] [address]  . LDH 10/11/2018 137  98 - 192 U/L Final   Performed at [facility], [address], [place], [place] [address]  . Ferritin 10/11/2018 40  24 - 336 ng/mL Final   Performed at [facility], [address] [place], [place] [address]     Pathology Orders Placed This Encounter  Procedures  . CT CHEST W CONTRAST    Standing Status:  Future    Standing Expiration Date:   10/20/2019    Order Specific Question:   If indicated for the ordered procedure, I authorize the administration of contrast media per Radiology protocol    Answer:   Yes    Order Specific Question:   Preferred imaging location?    Answer:   [facility]    Order Specific Question:   Radiology Contrast Protocol - do NOT remove file path    Answer:    \\[email or web address]  . CT ABDOMEN PELVIS W CONTRAST    Standing Status:   Future    Standing Expiration Date:   10/20/2019    Order Specific Question:   If indicated for the ordered procedure, I authorize the administration of contrast media per Radiology protocol    Answer:   Yes    Order Specific Question:   Preferred imaging location?    Answer:   [facility]    Order Specific Question:   Is Oral Contrast requested for this exam?    Answer:   Yes, Per Radiology protocol    Order Specific Question:   Radiology Contrast Protocol - do NOT remove file path    Answer:   \\[email or web address]  . CBC with Differential/Platelet    Standing Status:   Future    Standing Expiration Date:   10/20/2020  . Comprehensive metabolic panel    Standing Status:   Future    Standing Expiration Date:   10/20/2020  . Lactate dehydrogenase    Standing Status:   Future    Standing Expiration Date:   10/20/2020       Zoila Shutter MD

## 2019-01-09 NOTE — Progress Notes (Signed)
HISTORY AND PHYSICAL     CC:  follow up. Requesting Provider:  Hilbert Corrigan*  HPI: This is a 78 y.o. male who is here today for follow up.  He has hx of AAA that was discovered incidentally on CT scan for complex renal mass and hx of laparoscopic nephrectomy in 2018.  At his visit in July 2019, it measured 4.8cm, right CIA 1.2cm, Left CIA 1.2cm.  Pt has hx of afib and is on Xarelto.  He has hx of CHF.  He also has a hx of BLE edema.  He resides in a SNF at [facility].    The pt returns today for follow up.  He states he continues to do well since his last visit.  He states that he does walk with a cane, but when he gets lazy, he will use a wheelchair.  He denies any non healing wounds on his feet.  He does have swelling in his legs.   He denies any stroke sx.   The pt is on a statin for cholesterol management.    The pt does not have diabetes. The pt is  on BB and ACEI for hypertension.  The pt is not on an aspirin.  Tobacco hx:  Never smoked. The pt is on Xarelto for afib.  Past Medical History:  Diagnosis Date  . AAA (abdominal aortic aneurysm) ([place])   . Abdominal aortic ectasia ([place])   . Atherosclerosis of aorta ([place])   . Atherosclerosis of aorta ([employer])   . Atrial fibrillation ([place])   . Benign prostatic hyperplasia   . Cancer [facility])    malignant neoplasm of right kidney per records from [facility]  . Cognitive communication deficit   . Congestive heart failure (CHF) ([place])   . Diverticulitis   . Dysrhythmia    Atrial Fibrillation per records from [facility]  . Hypertension, essential   . Neoplasm of right kidney   . Pneumonia    from records from [facility]  . Thrombocytopenia ([place])   . Vitamin D deficiency     Past Surgical History:  Procedure Laterality Date  . CATARACT EXTRACTION Bilateral   . LAPAROSCOPIC NEPHRECTOMY Right 08/22/2017   Procedure: LAPAROSCOPIC RADICAL NEPHRECTOMY;  Surgeon:  Raynelle Bring, MD;  Location: [facility];  Service: Urology;  Laterality: Right;    No Known Allergies  Current Outpatient Medications  Medication Sig Dispense Refill  . Amino Acids-Protein Hydrolys (FEEDING SUPPLEMENT, PRO-STAT SUGAR FREE 64,) LIQD Take 30 mLs by mouth 3 (three) times daily with meals.    Marland Kitchen atorvastatin (LIPITOR) 20 MG tablet Take 1 tablet (20 mg total) by mouth daily. 90 tablet 3  . cyanocobalamin (,VITAMIN B-12,) 1000 MCG/ML injection Inject 1,000 mcg into the muscle every 30 (thirty) days.    . furosemide (LASIX) 40 MG tablet Take 40 mg by mouth daily.    Marland Kitchen lisinopril (PRINIVIL,ZESTRIL) 20 MG tablet Take 20 mg by mouth daily.    . metoprolol tartrate (LOPRESSOR) 25 MG tablet Take 0.5 tablets (12.5 mg total) by mouth 2 (two) times daily. 60 tablet 0  . polyethylene glycol (MIRALAX / GLYCOLAX) packet Take 17 g by mouth daily.    . Rivaroxaban (XARELTO) 15 MG TABS tablet Take 1 tablet (15 mg total) by mouth daily. 30 tablet 6  . tamsulosin (FLOMAX) 0.4 MG CAPS capsule Take 0.4 mg by mouth daily.     No current facility-administered medications for this visit.  Family History  Problem Relation Age of Onset  . Cancer Brother     Social History   Socioeconomic History  . Marital status: Single    Spouse name: Not on file  . Number of children: Not on file  . Years of education: Not on file  . Highest education level: Not on file  Occupational History  . Not on file  Social Needs  . Financial resource strain: Not on file  . Food insecurity:    Worry: Not on file    Inability: Not on file  . Transportation needs:    Medical: Not on file    Non-medical: Not on file  Tobacco Use  . Smoking status: Former Research scientist (life sciences)  . Smokeless tobacco: Never Used  Substance and Sexual Activity  . Alcohol use: No  . Drug use: No  . Sexual activity: Not Currently  Lifestyle  . Physical activity:    Days per week: Not on file    Minutes per session: Not on file  . Stress:  Not on file  Relationships  . Social connections:    Talks on phone: Not on file    Gets together: Not on file    Attends religious service: Not on file    Active member of club or organization: Not on file    Attends meetings of clubs or organizations: Not on file    Relationship status: Not on file  . Intimate partner violence:    Fear of current or ex partner: Not on file    Emotionally abused: Not on file    Physically abused: Not on file    Forced sexual activity: Not on file  Other Topics Concern  . Not on file  Social History Narrative  . Not on file     REVIEW OF SYSTEMS:   [X]  denotes positive finding, [ ]  denotes negative finding Cardiac  Comments:  Chest pain or chest pressure:    Shortness of breath upon exertion:    Short of breath when lying flat:    Irregular heart rhythm:        Vascular    Pain in calf, thigh, or hip brought on by ambulation:    Pain in feet at night that wakes you up from your sleep:     Blood clot in your veins:    Leg swelling:  x       Pulmonary    Oxygen at home:    Productive cough:     Wheezing:         Neurologic    Sudden weakness in arms or legs:     Sudden numbness in arms or legs:     Sudden onset of difficulty speaking or slurred speech:    Temporary loss of vision in one eye:     Problems with dizziness:         Gastrointestinal    Blood in stool:     Vomited blood:         Genitourinary    Burning when urinating:     Blood in urine:        Psychiatric    Major depression:         Hematologic    Bleeding problems:    Problems with blood clotting too easily:        Skin    Rashes or ulcers:        Constitutional    Fever or chills:      PHYSICAL  EXAMINATION:  Today's Vitals   01/15/19 1136  BP: 136/72  Pulse: 73  Resp: 18  SpO2: 95%  Weight: (!) 302 lb (137 kg)  Height: 6\' 2"  (1.88 m)   Body mass index is 38.77 kg/m.   General:  WDWN in NAD; vital signs documented above Gait: Not  observed HENT: WNL, normocephalic Pulmonary: normal non-labored breathing , without Rales, rhonchi,  wheezing Cardiac: regular HR, without  Murmurs; without carotid bruits Abdomen: soft, NT, no masses Skin: without rashes Vascular Exam/Pulses:  Right Left  Radial 2+ (normal) 2+ (normal)  Ulnar 1+ (weak) 1+ (weak)  Popliteal Unable to palpate  Unable to palpate   DP monophasic monophasic  PT biphasic biphasic   Extremities: without ischemic changes, without Gangrene , without cellulitis; without open wounds; BLE edema.  Tiny round healing wound on right 2nd toe Musculoskeletal: no muscle wasting or atrophy  Neurologic: A&O X 3;  No focal weakness or paresthesias are detected Psychiatric:  The pt has Normal affect.   Non-Invasive Vascular Imaging:   ABI's/TBI's on 01/15/2019: Right:  0.98/0.71  PT (B)  DP (B) Left:  1.41/[place]/0.65  PT (B)  DP (B)  AAA duplex 07/10/18: Abdominal Aorta: There is evidence of abnormal dilitation of the Mid Abdominal aorta. The largest aortic measurement is 4.8 cm.  CT abdomen/pelvis 10/16/18: IMPRESSION: 1. Stable exam. No new or progressive interval findings. Specifically, no findings to suggest local recurrence or metastatic disease in this patient with a history of right renal cell carcinoma. 2. Stable appearance of the 10 mm hypodense lesion lower pole left kidney with no change in the exophytic interpolar left renal lesion, likely a cyst. 3. 4.1 cm infrarenal abdominal aortic aneurysm. Recommend followup by [place] in 1 year. This recommendation follows ACR consensus guidelines: [name] on Vascular Findings. [name] 2013; 10:789-794. 4. Cholelithiasis. 5. Stable appearance exophytic large right thyroid nodule.  Carotid duplex 01/12/2019: Right:  1-39% stenosis Left:  1-39% stenosis  Vertebrals:  Bilateral vertebral arteries demonstrate antegrade flow. Subclavians: Normal flow  hemodynamics were seen in bilateral subclavian arteries.  Pt meds includes: Statin:  Yes.   Beta Blocker:  Yes.   Aspirin:  No. ACEI:  Yes.   ARB:  No. CCB use:  No Other Antiplatelet/Anticoagulant:  Yes Xarelto   ASSESSMENT/PLAN:: 78 y.o. male here for follow up for AAA   -pt aorta measures 4.2cm today on duplex, which is similar to measurement on CT scan back in July.  He did have some slight enlargement of the bilateral CIA.  I discussed this with Dr. Trula Slade.  Would not consider intervention until they are 3cm and the right is 1.6 and left is 1.2.  Will have him return in 6 months with aortoiliac duplex and abi's. -carotid duplex 1-39% bilaterally and he is asymptomatic.  Would not repeat unless he needs intervention in the future.  -he will call sooner should he need anything before his next visit.    Leontine Locket, PA-C [facility] [phone]  Clinic MD:   Trula Slade

## 2019-01-10 IMAGING — CT CT ABD-PELV W/ CM
2 of 5 series · 13 of 46 positions shown, 15 images · IV contrast (Isovue)
Comparison: Multiple exams, including 03/10/2018

CLINICAL DATA: Restaging of right renal cell carcinoma. Right
laparoscopic radical nephrectomy in 1845.

EXAM:
CT CHEST, ABDOMEN, AND PELVIS WITH CONTRAST
TECHNIQUE: Multidetector CT imaging of the chest, abdomen and pelvis was
performed following the standard protocol during bolus
administration of intravenous contrast.
CONTRAST:  75mL OMNIPAQUE IOHEXOL 300 MG/ML  SOLN

[Series 2: cap with · axial · 0.98mm/px · z∈[+990,+1560]mm · 10 of 140 slices shown, 12 images]
[im 13/140  soft-tissue]
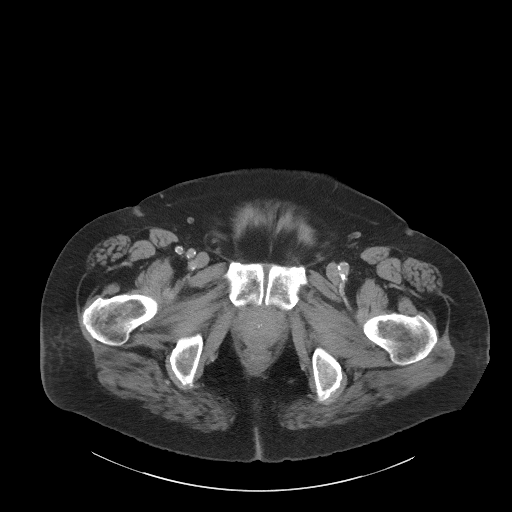
[im 13/140  bone]
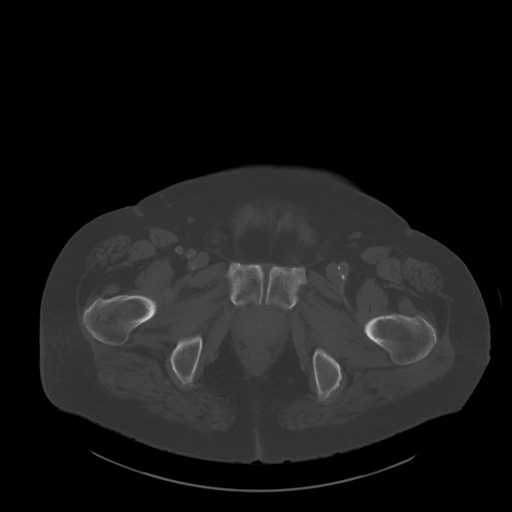
[im 26/140  soft-tissue]
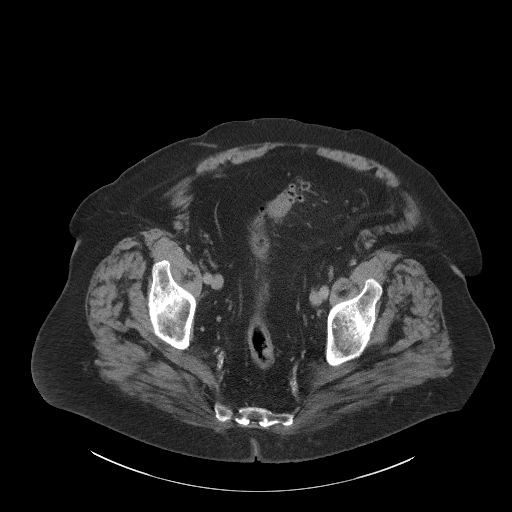
[im 38/140  soft-tissue]
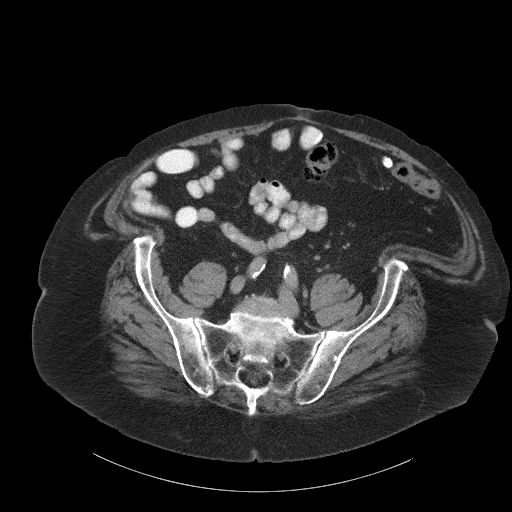
[im 51/140  soft-tissue]
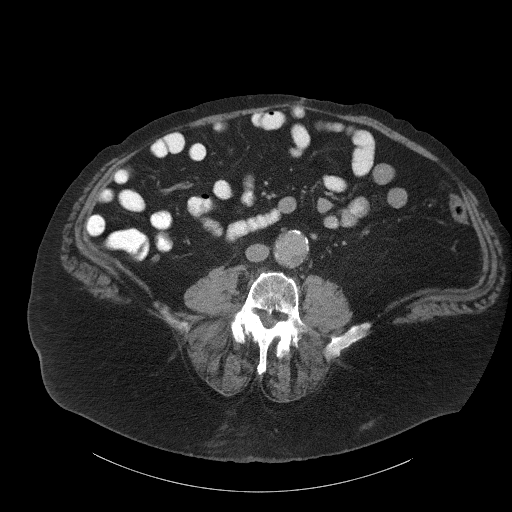
[im 64/140  soft-tissue]
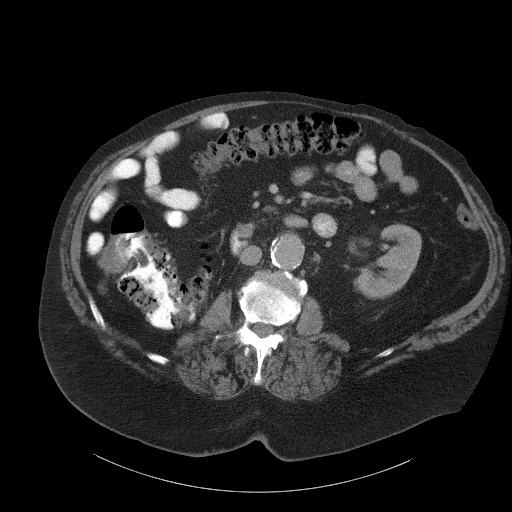
[im 76/140  soft-tissue]
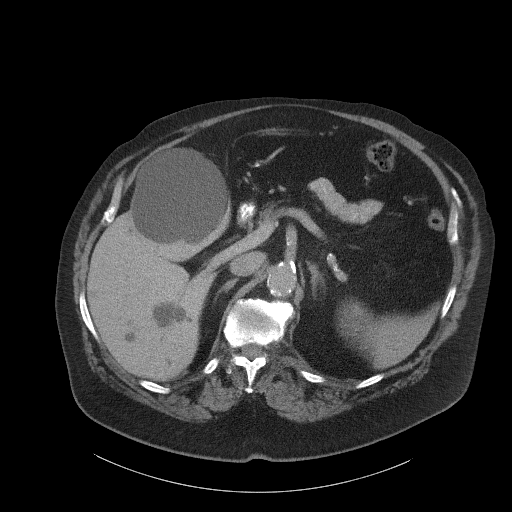
[im 89/140  soft-tissue]
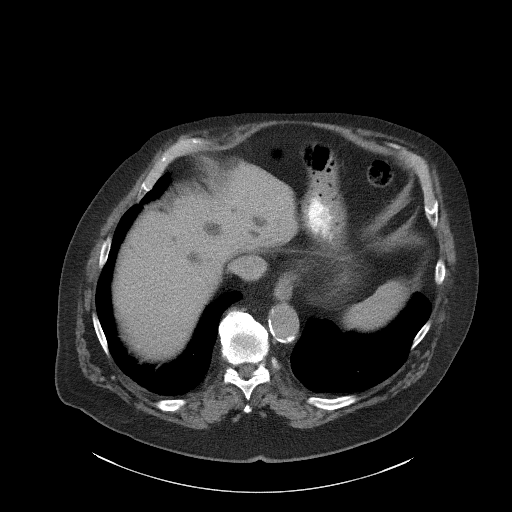
[im 102/140  soft-tissue]
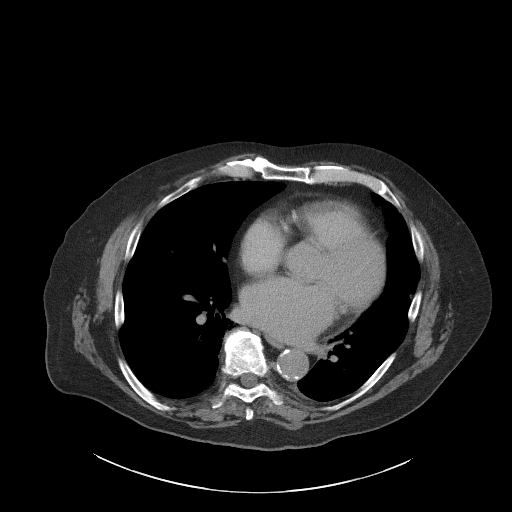
[im 114/140  soft-tissue]
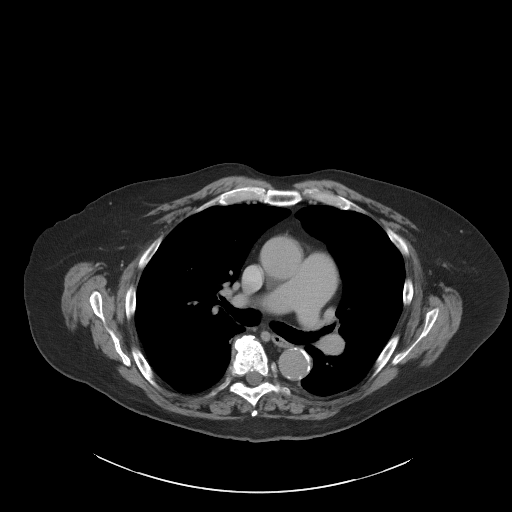
[im 114/140  bone]
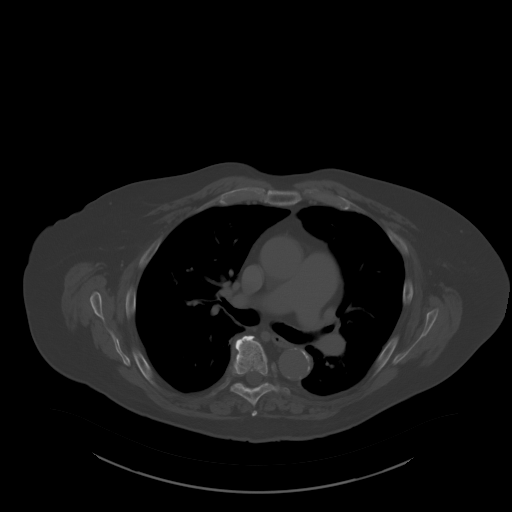
[im 127/140  soft-tissue]
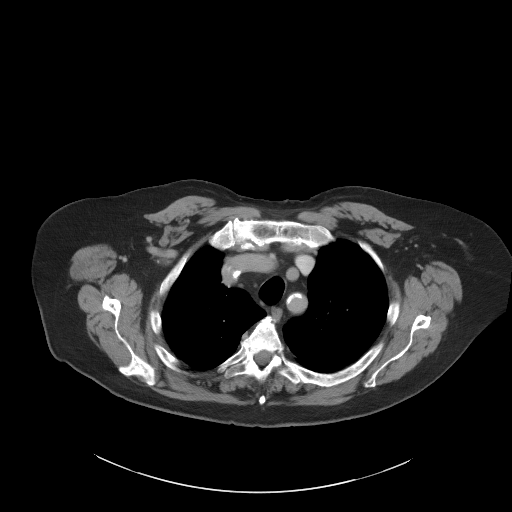

[Series 5: coronals · coronal · 0.84mm/px · 3 of 220 slices shown]
[im 74/220  soft-tissue]
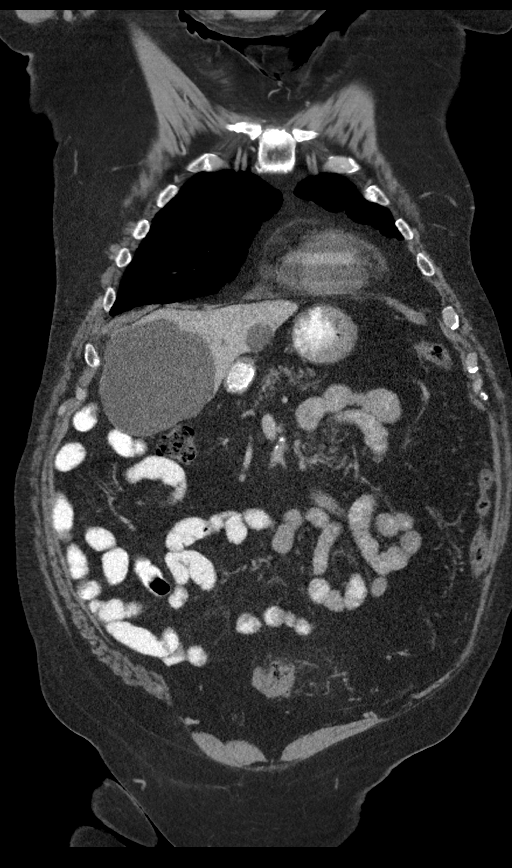
[im 98/220  soft-tissue]
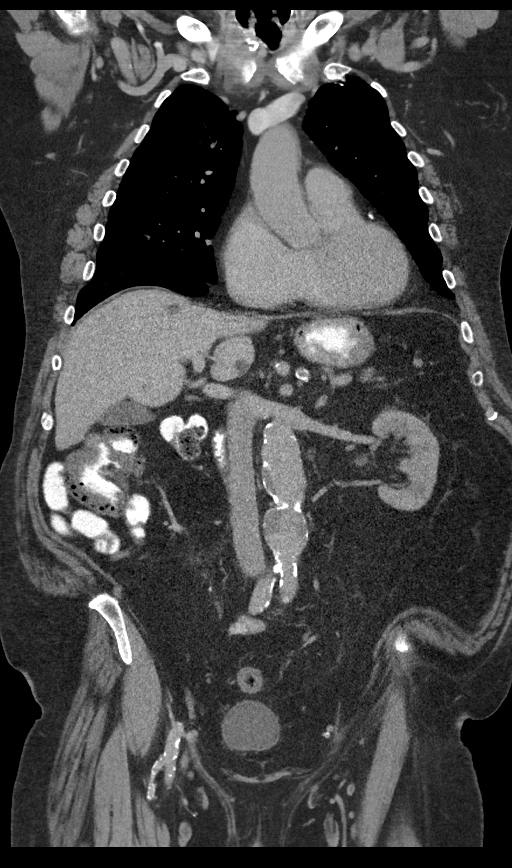
[im 122/220  soft-tissue]
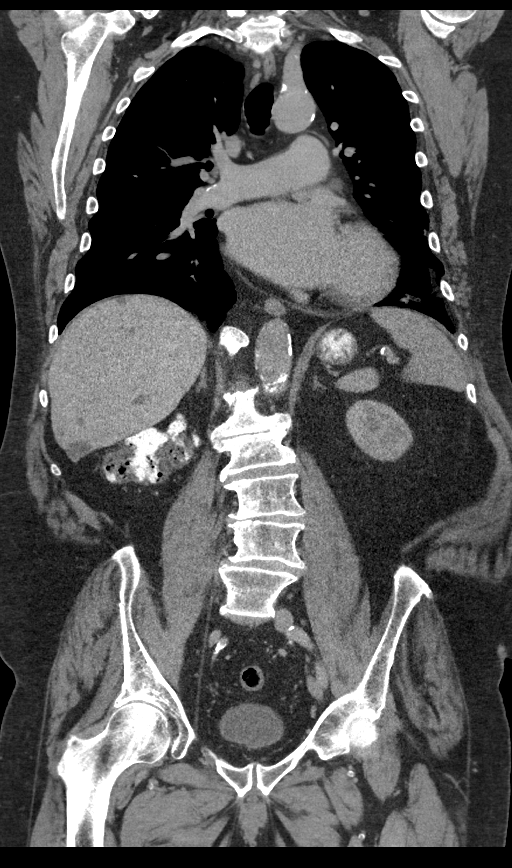

[13 of 46 positions shown; findings below may reference images not displayed]

FINDINGS: CT CHEST FINDINGS

Cardiovascular: Coronary, aortic arch, and branch vessel
atherosclerotic vascular disease. Ascending aortic aneurysm at
cm diameter.

Mediastinum/Nodes: Right thyroid nodule with some dense associated
calcifications, 4.1 by 3.3 cm, essentially stable from earliest
available comparison of 05/05/2017.

Mild cardiomegaly.

Lungs/Pleura: Unremarkable

Musculoskeletal: Thoracic spondylosis.

CT ABDOMEN PELVIS FINDINGS

Hepatobiliary: Scattered hepatic hypodense lesions favoring cysts.
Known hemangioma in segment 7 of the liver measuring 2.8 by 1.9 cm.
A gallstone in the gallbladder measures 1.2 cm in diameter.

Pancreas: Unremarkable

Spleen: Unremarkable

Adrenals/Urinary Tract: Chronically stable mild nodularity of the
right adrenal gland compared to 03/27/2010, without mass. No
recurrent mass along the nephrectomy site. 1.3 cm cyst of the left
mid kidney anteriorly on image 75/2. Nonspecific 1.0 cm hypodense
lesion of the left kidney lower pole on image 85/2.

Stomach/Bowel: 1.5 cm pedunculated lipoma in the proximal transverse
duodenum. Sigmoid diverticulosis with faint inflammatory stranding
along the distal sigmoid colon compatible with mild acute
diverticulitis. Mild wall thickening in the distal sigmoid colon and
rectum, not present on the 03/10/2018 exam.

Vascular/Lymphatic: Aortoiliac atherosclerotic vascular disease.
Bilobed infrarenal abdominal aortic aneurysm with upper portion
cm anterior-posterior and lower portion 4.0 cm, both shown on image
125/6. Both have mural thrombus.

Reproductive: The prostate gland measures 5.2 by 4.1 by 5.0 cm
(volume = 56 cm^3).

Other: No supplemental non-categorized findings.

Musculoskeletal: Stable appearance of eliminated disc space and
endplate apposition and spurring at the L1-2 level. Levoconvex
lumbar scoliosis with spondylosis and degenerative disc disease and
low-level resulting foraminal impingement most notable on the right
side at L4-5.

Small umbilical hernia primarily containing adipose tissues.
IMPRESSION: 1. No findings of recurrent malignancy.
2. A nonspecific 1.0 cm hypodense lesion of the left kidney lower
pole is observed. Due to its small size and motion artifact this was
difficult to characterize on the prior MRI from 01/10/2017, and
overall is nonspecific although statistically is most likely to
represent a cyst.
3. Mild acute sigmoid colon diverticulitis. Mild diffuse wall
thickening in the distal sigmoid colon and rectum, potentially
incidental or due to low-grade inflammation.
4. Infrarenal abdominal aortic aneurysm, 4.2 cm in diameter.
Recommend followup by ultrasound in 1 year. This recommendation
follows ACR consensus guidelines: White Paper of the ACR Incidental
Findings Committee II on Vascular Findings. [HOSPITAL] 3837;
[DATE].
5. Ascending thoracic aortic aneurysm, stable at 4.2 cm in diameter.
Recommend annual imaging followup by CTA or MRA. This recommendation
follows 6444 ACCF/AHA/AATS/ACR/ASA/SCA/BARAMASHVILI/MOOLMAN/INNOMA/OMAJ Guidelines
for the Diagnosis and Management of Patients with Thoracic Aortic
Disease. Circulation. 6444; 121: [id number]
6. Large right thyroid nodule. This has been previously biopsied on
10/04/2017.
7. Other imaging findings of potential clinical significance: Mild
cardiomegaly. Thoracolumbar spondylosis and degenerative disc
disease. Numerous hepatic cysts along with a known hepatic
hemangioma in segment 7 of the liver. Small duodenal lipoma. Aortic
Atherosclerosis ([id number]). Mild prostatomegaly. Small umbilical
hernia containing adipose tissue. Cholelithiasis. Coronary
atherosclerosis.

## 2019-01-12 ENCOUNTER — Ambulatory Visit (INDEPENDENT_AMBULATORY_CARE_PROVIDER_SITE_OTHER)
Admission: RE | Admit: 2019-01-12 | Discharge: 2019-01-12 | Disposition: A | Discharge status: Home / Self Care | Payer: Medicare Other | Source: Ambulatory Visit | Attending: Surgery | Admitting: Surgery

## 2019-01-12 ENCOUNTER — Encounter (HOSPITAL_COMMUNITY): Payer: Medicare Other

## 2019-01-12 ENCOUNTER — Ambulatory Visit (HOSPITAL_COMMUNITY)
Admission: RE | Admit: 2019-01-12 | Discharge: 2019-01-12 | Disposition: A | Discharge status: Home / Self Care | Payer: Medicare Other | Source: Ambulatory Visit | Attending: Surgery | Admitting: Surgery

## 2019-01-12 ENCOUNTER — Other Ambulatory Visit (HOSPITAL_COMMUNITY): Payer: Medicare Other

## 2019-01-12 DIAGNOSIS — I714 Abdominal aortic aneurysm, without rupture, unspecified: Secondary | ICD-10-CM

## 2019-01-12 DIAGNOSIS — R0989 Other specified symptoms and signs involving the circulatory and respiratory systems: Secondary | ICD-10-CM | POA: Insufficient documentation

## 2019-01-15 ENCOUNTER — Ambulatory Visit (HOSPITAL_COMMUNITY)
Admission: RE | Admit: 2019-01-15 | Discharge: 2019-01-15 | Disposition: A | Discharge status: Home / Self Care | Payer: Medicare Other | Source: Ambulatory Visit | Attending: Surgery | Admitting: Surgery

## 2019-01-15 ENCOUNTER — Encounter (HOSPITAL_COMMUNITY): Payer: Self-pay

## 2019-01-15 ENCOUNTER — Encounter: Payer: Self-pay | Admitting: Family

## 2019-01-15 ENCOUNTER — Ambulatory Visit (INDEPENDENT_AMBULATORY_CARE_PROVIDER_SITE_OTHER): Payer: Medicare Other | Admitting: Physician Assistant

## 2019-01-15 ENCOUNTER — Other Ambulatory Visit: Payer: Self-pay

## 2019-01-15 VITALS — BP 136/72 | HR 73 | Resp 18 | Ht 74.0 in | Wt 302.0 lb

## 2019-01-15 DIAGNOSIS — R0989 Other specified symptoms and signs involving the circulatory and respiratory systems: Secondary | ICD-10-CM | POA: Insufficient documentation

## 2019-01-15 DIAGNOSIS — I714 Abdominal aortic aneurysm, without rupture, unspecified: Secondary | ICD-10-CM

## 2019-03-08 ENCOUNTER — Telehealth: Payer: Self-pay

## 2019-03-08 NOTE — Telephone Encounter (Signed)
   Cardiac Questionnaire:    Since your last visit or hospitalization:    1. Have you been having new or worsening chest pain? no   2. Have you been having new or worsening shortness of breath? no 3. Have you been having new or worsening leg swelling, wt gain, or increase in abdominal girth (pants fitting more tightly)? no   4. Have you had any passing out spells? no      I spoke with Lamount Cohen at [facility] where pt is a resident.She states Mr.Kroh is doing well there and she has no concerns regarding his health.

## 2019-03-20 ENCOUNTER — Ambulatory Visit: Payer: Medicare Other | Admitting: Cardiovascular Disease

## 2019-03-31 ENCOUNTER — Other Ambulatory Visit
Admission: RE | Admit: 2019-03-31 | Discharge: 2019-03-31 | Disposition: A | Discharge status: Home / Self Care | Payer: Medicare Other | Source: Ambulatory Visit | Attending: Internal Medicine | Admitting: Internal Medicine

## 2019-03-31 DIAGNOSIS — N19 Unspecified kidney failure: Secondary | ICD-10-CM | POA: Diagnosis present

## 2019-03-31 LAB — CBC WITH DIFFERENTIAL/PLATELET
Abs Immature Granulocytes: 0.02 10*3/uL (ref 0.00–0.07)
Basophils Absolute: 0.1 10*3/uL (ref 0.0–0.1)
Basophils Relative: 1 %
Eosinophils Absolute: 0.4 10*3/uL (ref 0.0–0.5)
Eosinophils Relative: 6 %
HCT: 40.8 % (ref 39.0–52.0)
Hemoglobin: 13.1 g/dL (ref 13.0–17.0)
Immature Granulocytes: 0 %
Lymphocytes Relative: 18 %
Lymphs Abs: 1.1 10*3/uL (ref 0.7–4.0)
MCH: 30.8 pg (ref 26.0–34.0)
MCHC: 32.1 g/dL (ref 30.0–36.0)
MCV: 95.8 fL (ref 80.0–100.0)
Monocytes Absolute: 0.8 10*3/uL (ref 0.1–1.0)
Monocytes Relative: 12 %
Neutro Abs: 4.1 10*3/uL (ref 1.7–7.7)
Neutrophils Relative %: 63 %
Platelets: 162 10*3/uL (ref 150–400)
RBC: 4.26 MIL/uL (ref 4.22–5.81)
RDW: 14.1 % (ref 11.5–15.5)
WBC: 6.5 10*3/uL (ref 4.0–10.5)
nRBC: 0 % (ref 0.0–0.2)

## 2019-03-31 LAB — BASIC METABOLIC PANEL
Anion gap: 10 (ref 5–15)
BUN: 50 mg/dL — ABNORMAL HIGH (ref 8–23)
CO2: 22 mmol/L (ref 22–32)
Calcium: 8.3 mg/dL — ABNORMAL LOW (ref 8.9–10.3)
Chloride: 104 mmol/L (ref 98–111)
Creatinine, Ser: 1.78 mg/dL — ABNORMAL HIGH (ref 0.61–1.24)
GFR calc Af Amer: 41 mL/min — ABNORMAL LOW (ref >60)
GFR calc non Af Amer: 36 mL/min — ABNORMAL LOW (ref >60)
Glucose, Bld: 81 mg/dL (ref 70–99)
Potassium: 4.3 mmol/L (ref 3.5–5.1)
Sodium: 136 mmol/L (ref 135–145)

## 2019-04-19 ENCOUNTER — Other Ambulatory Visit (HOSPITAL_COMMUNITY): Payer: Self-pay | Admitting: *Deleted

## 2019-04-19 DIAGNOSIS — D509 Iron deficiency anemia, unspecified: Secondary | ICD-10-CM

## 2019-04-19 DIAGNOSIS — D649 Anemia, unspecified: Secondary | ICD-10-CM

## 2019-04-20 ENCOUNTER — Ambulatory Visit (HOSPITAL_COMMUNITY): Admission: RE | Admit: 2019-04-20 | Payer: Medicare Other | Source: Ambulatory Visit

## 2019-04-20 ENCOUNTER — Other Ambulatory Visit (HOSPITAL_COMMUNITY): Payer: Medicare Other

## 2019-04-27 ENCOUNTER — Ambulatory Visit (HOSPITAL_COMMUNITY): Payer: Medicare Other | Admitting: Hematology

## 2019-07-13 ENCOUNTER — Telehealth (INDEPENDENT_AMBULATORY_CARE_PROVIDER_SITE_OTHER): Payer: Medicare Other | Admitting: Cardiovascular Disease

## 2019-07-13 ENCOUNTER — Other Ambulatory Visit: Payer: Self-pay | Admitting: Family

## 2019-07-13 ENCOUNTER — Encounter: Payer: Self-pay | Admitting: Cardiovascular Disease

## 2019-07-13 VITALS — BP 122/74 | HR 72 | Ht 72.0 in | Wt 325.0 lb

## 2019-07-13 DIAGNOSIS — I4821 Permanent atrial fibrillation: Secondary | ICD-10-CM | POA: Diagnosis not present

## 2019-07-13 DIAGNOSIS — I5032 Chronic diastolic (congestive) heart failure: Secondary | ICD-10-CM

## 2019-07-13 DIAGNOSIS — N183 Chronic kidney disease, stage 3 unspecified: Secondary | ICD-10-CM

## 2019-07-13 DIAGNOSIS — R0989 Other specified symptoms and signs involving the circulatory and respiratory systems: Secondary | ICD-10-CM

## 2019-07-13 DIAGNOSIS — I714 Abdominal aortic aneurysm, without rupture, unspecified: Secondary | ICD-10-CM

## 2019-07-13 DIAGNOSIS — I25118 Atherosclerotic heart disease of native coronary artery with other forms of angina pectoris: Secondary | ICD-10-CM

## 2019-07-13 DIAGNOSIS — I10 Essential (primary) hypertension: Secondary | ICD-10-CM

## 2019-07-13 DIAGNOSIS — E785 Hyperlipidemia, unspecified: Secondary | ICD-10-CM

## 2019-07-13 NOTE — Patient Instructions (Addendum)
Medication Instructions: Your physician recommends that you continue on your current medications as directed. Please refer to the Current Medication list given to you today.   Labwork: none  Procedures/Testing: none  Follow-Up: Virtual Visit in 6 months with Physician Assistant  Any Additional Special Instructions Will Be Listed Below (If Applicable).     If you need a refill on your cardiac medications before your next appointment, please call your pharmacy.     Thank you for choosing [facility] !

## 2019-07-13 NOTE — Progress Notes (Signed)
Virtual Visit via Telephone Note   This visit type was conducted due to national recommendations for restrictions regarding the COVID-19 Pandemic (e.g. social distancing) in an effort to limit this patient's exposure and mitigate transmission in our community.  Due to his co-morbid illnesses, this patient is at least at moderate risk for complications without adequate follow up.  This format is felt to be most appropriate for this patient at this time.  The patient did not have access to video technology/had technical difficulties with video requiring transitioning to audio format only (telephone).  All issues noted in this document were discussed and addressed.  No physical exam could be performed with this format.  Please refer to the patient's chart for his  consent to telehealth for [facility].   Date:  07/13/2019   ID:  Vincent Pope, DOB [birth year], MRN [id number]  Patient Location: Home Provider Location: Home  PCP:  Hilbert Corrigan, MD  Cardiologist:  Kate Sable, MD  Electrophysiologist:  None   Evaluation Performed:  Follow-Up Visit  Chief Complaint:  A fib  History of Present Illness:    Vincent Pope is a 78 y.o. male with permanent atrial fibrillation and presumed CAD based on abnormal nuclear stress test.  He resides at [facility].  He has chronic lower extremity edema and wears compression stockings. Chronic exertional dyspnea is stable.  I spoke with Doristine Johns (supervisor) who says he has no complaints (no chest pain or dizziness). He uses a walker when doing PT. He has not had any bleeding problems. She said "He's actually one of our easiest patients".  The patient does not have symptoms concerning for COVID-19 infection (fever, chills, cough, or new shortness of breath).    Past Medical History:  Diagnosis Date  . AAA (abdominal aortic aneurysm) ([place])   . Abdominal aortic ectasia ([place])   . Atherosclerosis of aorta ([place])   .  Atherosclerosis of aorta ([place])   . Atrial fibrillation ([place])   . Benign prostatic hyperplasia   . Cancer [facility])    malignant neoplasm of right kidney per records from [facility]  . Cognitive communication deficit   . Congestive heart failure (CHF) ([place])   . Diverticulitis   . Dysrhythmia    Atrial Fibrillation per records from [facility]  . Hypertension, essential   . Neoplasm of right kidney   . Pneumonia    from records from [facility]  . Thrombocytopenia ([place])   . Vitamin D deficiency    Past Surgical History:  Procedure Laterality Date  . CATARACT EXTRACTION Bilateral   . LAPAROSCOPIC NEPHRECTOMY Right 08/22/2017   Procedure: LAPAROSCOPIC RADICAL NEPHRECTOMY;  Surgeon: Raynelle Bring, MD;  Location: [facility];  Service: Urology;  Laterality: Right;     Current Meds  Medication Sig  . Amino Acids-Protein Hydrolys (FEEDING SUPPLEMENT, PRO-STAT SUGAR FREE 64,) LIQD Take 30 mLs by mouth 3 (three) times daily with meals.  Marland Kitchen atorvastatin (LIPITOR) 20 MG tablet Take 1 tablet (20 mg total) by mouth daily.  . baclofen (LIORESAL) 10 MG tablet Take 5 mg by mouth 4 (four) times daily as needed for muscle spasms.  . cyanocobalamin (,VITAMIN B-12,) 1000 MCG/ML injection Inject 1,000 mcg into the muscle every 30 (thirty) days.  . furosemide (LASIX) 40 MG tablet Take 40 mg by mouth daily.  Marland Kitchen lisinopril (ZESTRIL) 10 MG tablet Take 10 mg by mouth daily.  . metoprolol tartrate (LOPRESSOR) 25 MG tablet Take 0.5 tablets (  12.5 mg total) by mouth 2 (two) times daily.  . polyethylene glycol (MIRALAX / GLYCOLAX) packet Take 17 g by mouth daily.  . Rivaroxaban (XARELTO) 15 MG TABS tablet Take 1 tablet (15 mg total) by mouth daily.  . tamsulosin (FLOMAX) 0.4 MG CAPS capsule Take 0.4 mg by mouth daily.  . [DISCONTINUED] lisinopril (PRINIVIL,ZESTRIL) 20 MG tablet Take 20 mg by mouth daily.     Allergies:   Patient has no known allergies.   Social  History   Tobacco Use  . Smoking status: Former Research scientist (life sciences)  . Smokeless tobacco: Never Used  Substance Use Topics  . Alcohol use: No  . Drug use: No     Family Hx: The patient's family history includes Cancer in his brother.  ROS:   Please see the history of present illness.     All other systems reviewed and are negative.   Prior CV studies:   The following studies were reviewed today:  Nuclear stress test on 06/29/17 showed prior inferior wall myocardial infarction with peri-infarct ischemia and additional variable soft tissue attenuation artifact.  It was deemed an intermediate risk study.  Labs/Other Tests and Data Reviewed:    EKG:  No ECG reviewed.  Recent Labs: 10/11/2018: ALT 14 03/31/2019: BUN 50; Creatinine, Ser 1.78; Hemoglobin 13.1; Platelets 162; Potassium 4.3; Sodium 136   Recent Lipid Panel Lab Results  Component Value Date/Time   CHOL 113 09/20/2018 02:30 PM   TRIG 66 09/20/2018 02:30 PM   HDL 39 (L) 09/20/2018 02:30 PM   CHOLHDL 2.9 09/20/2018 02:30 PM   LDLCALC 61 09/20/2018 02:30 PM    Wt Readings from Last 3 Encounters:  07/13/19 (!) 325 lb (147.4 kg)  01/15/19 (!) 302 lb (137 kg)  10/20/18 (!) 302 lb 5 oz (137.1 kg)     Objective:    Vital Signs:  BP 122/74   Pulse 72   Ht 6' (1.829 m)   Wt (!) 325 lb (147.4 kg)   BMI 44.08 kg/m    VITAL SIGNS:  reviewed  ASSESSMENT & PLAN:    1.  Permanent atrial fibrillation: Symptomatically stable.  Heart rate is controlled on metoprolol 12.5 mg twice daily.  Higher doses of metoprolol led to asymptomatic pauses while sleeping and the dose was reduced in September 2018. Continue renally dosed Xarelto 15 mg daily.  2.  Abdominal aortic aneurysm: Followed by vascular surgery. 4.1 cm by CT on 10/16/18.  3.  Coronary artery disease: He is currently on metoprolol and atorvastatin 20 mg.  No ASA as he is on Xarelto. No anginal symptoms.  4.  Hypertension: Blood pressure is controlled. No changes.  5.  Chronic diastolic HF: Euvolemic. Has chronic LE edema and takes Lasix 40 mg daily and wears compression stockings.  6. Hyperlipidemia: LDL 61 on 09/20/18. Continue atorvastatin 20 mg.  7. CKD III: SCr 1.78 on 03/31/19.    COVID-19 Education: The signs and symptoms of COVID-19 were discussed with the patient and how to seek care for testing (follow up with PCP or arrange E-visit).  The importance of social distancing was discussed today.  Time:   Today, I have spent 10 minutes with the patient with telehealth technology discussing the above problems.     Medication Adjustments/Labs and Tests Ordered: Current medicines are reviewed at length with the patient today.  Concerns regarding medicines are outlined above.   Tests Ordered: No orders of the defined types were placed in this encounter.   Medication Changes: No orders  of the defined types were placed in this encounter.   Follow Up:  Virtual Visit in 6 month(s)  Signed, Kate Sable, MD  07/13/2019 8:42 AM    [place] [facility]

## 2019-07-13 NOTE — Progress Notes (Signed)
.   Virtual Visit Pre-Appointment Phone Call  "(Name), I am calling you today to discuss your upcoming appointment. We are currently trying to limit exposure to the virus that causes COVID-19 by seeing patients at home rather than in the office."  1. "What is the BEST phone number to call the day of the visit?" - include this in appointment notes  2. "Do you have or have access to (through a family member/friend) a smartphone with video capability that we can use for your visit?" a. If yes - list this number in appt notes as "cell" (if different from BEST phone #) and list the appointment type as a VIDEO visit in appointment notes b. If no - list the appointment type as a PHONE visit in appointment notes  3. Confirm consent - "In the setting of the current Covid19 crisis, you are scheduled for a (phone or video) visit with your provider on (date) at (time).  Just as we do with many in-office visits, in order for you to participate in this visit, we must obtain consent.  If you'd like, I can send this to your mychart (if signed up) or email for you to review.  Otherwise, I can obtain your verbal consent now.  All virtual visits are billed to your insurance company just like a normal visit would be.  By agreeing to a virtual visit, we'd like you to understand that the technology does not allow for your provider to perform an examination, and thus may limit your provider's ability to fully assess your condition. If your provider identifies any concerns that need to be evaluated in person, we will make arrangements to do so.  Finally, though the technology is pretty good, we cannot assure that it will always work on either your or our end, and in the setting of a video visit, we may have to convert it to a phone-only visit.  In either situation, we cannot ensure that we have a secure connection.  Are you willing to proceed?" STAFF: Did the patient verbally acknowledge consent to telehealth visit? Document  YES/NO here: yes 4.   5. Advise patient to be prepared - "Two hours prior to your appointment, go ahead and check your blood pressure, pulse, oxygen saturation, and your weight (if you have the equipment to check those) and write them all down. When your visit starts, your provider will ask you for this information. If you have an Apple Watch or Kardia device, please plan to have heart rate information ready on the day of your appointment. Please have a pen and paper handy nearby the day of the visit as well."  6. Give patient instructions for MyChart download to smartphone OR Doximity/Doxy.me as below if video visit (depending on what platform provider is using)  7. Inform patient they will receive a phone call 15 minutes prior to their appointment time (may be from unknown caller ID) so they should be prepared to answer    TELEPHONE CALL NOTE  Armanii Pressnell has been deemed a candidate for a follow-up tele-health visit to limit community exposure during the Covid-19 pandemic. I spoke with the patient via phone to ensure availability of phone/video source, confirm preferred email & phone number, and discuss instructions and expectations.  I reminded Tandy Lewin to be prepared with any vital sign and/or heart rhythm information that could potentially be obtained via home monitoring, at the time of his visit. I reminded Dadrian Ballantine to expect a phone call prior to  his visit.  Bernita Raisin, RN 07/13/2019 10:01 AM   INSTRUCTIONS FOR DOWNLOADING THE MYCHART APP TO SMARTPHONE  - The patient must first make sure to have activated MyChart and know their login information - If Apple, go to [employer] and type in MyChart in the search bar and download the app. If Android, ask patient to go to [employer] and type in [facility] in the search bar and download the app. The app is free but as with any other app downloads, their phone may require them to verify saved payment information or  Apple/Android password.  - The patient will need to then log into the app with their MyChart username and password, and select [facility] as their healthcare provider to link the account. When it is time for your visit, go to the MyChart app, find appointments, and click Begin Video Visit. Be sure to Select Allow for your device to access the Microphone and Camera for your visit. You will then be connected, and your provider will be with you shortly.  **If they have any issues connecting, or need assistance please contact MyChart service desk [phone])**  **If using a computer, in order to ensure the best quality for their visit they will need to use either of the following Internet Browsers: [employer], or Google Chrome**  IF USING DOXIMITY or DOXY.ME - The patient will receive a link just prior to their visit by text.     FULL LENGTH CONSENT FOR TELE-HEALTH VISIT   I hereby voluntarily request, consent and authorize [facility] and its employed or contracted physicians, physician assistants, nurse practitioners or other licensed health care professionals (the Practitioner), to provide me with telemedicine health care services (the "Services") as deemed necessary by the treating Practitioner. I acknowledge and consent to receive the Services by the Practitioner via telemedicine. I understand that the telemedicine visit will involve communicating with the Practitioner through live audiovisual communication technology and the disclosure of certain medical information by electronic transmission. I acknowledge that I have been given the opportunity to request an in-person assessment or other available alternative prior to the telemedicine visit and am voluntarily participating in the telemedicine visit.  I understand that I have the right to withhold or withdraw my consent to the use of telemedicine in the course of my care at any time, without affecting my right to future care  or treatment, and that the Practitioner or I may terminate the telemedicine visit at any time. I understand that I have the right to inspect all information obtained and/or recorded in the course of the telemedicine visit and may receive copies of available information for a reasonable fee.  I understand that some of the potential risks of receiving the Services via telemedicine include:  Marland Kitchen Delay or interruption in medical evaluation due to technological equipment failure or disruption; . Information transmitted may not be sufficient (e.g. poor resolution of images) to allow for appropriate medical decision making by the Practitioner; and/or  . In rare instances, security protocols could fail, causing a breach of personal health information.  Furthermore, I acknowledge that it is my responsibility to provide information about my medical history, conditions and care that is complete and accurate to the best of my ability. I acknowledge that Practitioner's advice, recommendations, and/or decision may be based on factors not within their control, such as incomplete or inaccurate data provided by me or distortions of diagnostic images or specimens that may result from electronic transmissions.  I understand that the practice of medicine is not an exact science and that Practitioner makes no warranties or guarantees regarding treatment outcomes. I acknowledge that I will receive a copy of this consent concurrently upon execution via email to the email address I last provided but may also request a printed copy by calling the office of [facility].    I understand that my insurance will be billed for this visit.   I have read or had this consent read to me. . I understand the contents of this consent, which adequately explains the benefits and risks of the Services being provided via telemedicine.  . I have been provided ample opportunity to ask questions regarding this consent and the Services and have had  my questions answered to my satisfaction. . I give my informed consent for the services to be provided through the use of telemedicine in my medical care  By participating in this telemedicine visit I agree to the above.

## 2019-07-16 ENCOUNTER — Inpatient Hospital Stay (HOSPITAL_COMMUNITY): Admission: RE | Admit: 2019-07-16 | Payer: Medicare Other | Source: Ambulatory Visit

## 2019-07-16 ENCOUNTER — Ambulatory Visit: Payer: Medicare Other | Admitting: Cardiovascular Disease

## 2019-07-16 ENCOUNTER — Ambulatory Visit: Payer: Medicare Other | Admitting: Family

## 2020-01-08 NOTE — Progress Notes (Deleted)
{Choose 1 Note Type (Telehealth Visit or Telephone Visit):[phone]}   Date:  01/08/2020   ID:  Vincent Pope, DOB 1941-07-23, MRN [id number]  {Patient [phone]::"Home"} {Provider [phone]::"Home"}  PCP:  Hilbert Corrigan, MD  Cardiologist:  Kate Sable, MD *** Electrophysiologist:  None   Evaluation Performed:  {Choose Visit [id number] Visit"}  Chief Complaint:  ***  History of Present Illness:    Vincent Pope is a 79 y.o. male with past medical history of permanent atrial fibrillation, HTN, AAA (at 4.1 cm by imaging in 10/2018), chronic diastolic CHF, Stage 3 CKD and presumed CAD based off prior stress testing who presents for 58-month follow-up telehealth visit.  He most recently had a phone visit with Dr. Bronson Ing in 06/2019 and reported having chronic lower extremity edema but denied any acute change in his symptoms.  No reports of chest pain or dyspnea on exertion. Was continued on Lopressor 12.5 mg twice daily for rate control along with Xarelto for anticoagulation. Weight was stable at 325 lbs and he was continued on Lasix 40 mg daily.  The patient {does/does not:200015} have symptoms concerning for COVID-19 infection (fever, chills, cough, or new shortness of breath).    Past Medical History:  Diagnosis Date  . AAA (abdominal aortic aneurysm) ([place])   . Abdominal aortic ectasia ([place])   . Atherosclerosis of aorta ([place])   . Atherosclerosis of aorta ([place])   . Atrial fibrillation ([place])   . Benign prostatic hyperplasia   . Cancer [facility])    malignant neoplasm of right kidney per [facility] from [facility]  . Cognitive communication deficit   . Congestive heart failure (CHF) ([place])   . Diverticulitis   . Dysrhythmia    Atrial Fibrillation per records from [facility]  . Hypertension, essential   . Neoplasm of right kidney   . Pneumonia    from records from [facility]   . Thrombocytopenia ([place])   . Vitamin D deficiency    Past Surgical History:  Procedure Laterality Date  . CATARACT EXTRACTION Bilateral   . LAPAROSCOPIC NEPHRECTOMY Right 08/22/2017   Procedure: LAPAROSCOPIC RADICAL NEPHRECTOMY;  Surgeon: Raynelle Bring, MD;  Location: [facility];  Service: Urology;  Laterality: Right;     No outpatient medications have been marked as taking for the 01/09/20 encounter (Appointment) with Erma Heritage, PA-C.     Allergies:   Patient has no known allergies.   Social History   Tobacco Use  . Smoking status: Former Research scientist (life sciences)  . Smokeless tobacco: Never Used  Substance Use Topics  . Alcohol use: No  . Drug use: No     Family Hx: The patient's family history includes Cancer in his brother.  ROS:   Please see the history of present illness.    *** All other systems reviewed and are negative.   Prior CV studies:   The following studies were reviewed today:  NST: 06/2017  Atrial fibrillation seen throughout study.  Defect 1: There is a medium defect of moderate severity present in the basal inferoseptal, basal inferior, mid inferoseptal, mid inferior and apical inferior location.  Findings consistent with prior myocardial infarction with peri-infarct ischemia with additional variable soft tissue attenuation artifact.  This is an intermediate risk study.  Event Monitor: 09/2017  Atrial fibrillation and flutter, average HR 65 bpm. One 3 second pause noted.   Labs/Other Tests and Data Reviewed:    EKG:  {EKG/Telemetry Strips [phone]}  Recent Labs: 03/31/2019: BUN 50; Creatinine, Ser  1.78; Hemoglobin 13.1; Platelets 162; Potassium 4.3; Sodium 136   Recent Lipid Panel Lab Results  Component Value Date/Time   CHOL 113 09/20/2018 02:30 PM   TRIG 66 09/20/2018 02:30 PM   HDL 39 (L) 09/20/2018 02:30 PM   CHOLHDL 2.9 09/20/2018 02:30 PM   LDLCALC 61 09/20/2018 02:30 PM    Wt Readings from Last 3 Encounters:  07/13/19 (!)  325 lb (147.4 kg)  01/15/19 (!) 302 lb (137 kg)  10/20/18 (!) 302 lb 5 oz (137.1 kg)     Objective:    Vital Signs:  There were no vitals taken for this visit.   {[facility] Virtual Exam (Optional):[phone]::"VITAL SIGNS:  reviewed"}  ASSESSMENT & PLAN:    1. ***  COVID-19 Education: The signs and symptoms of COVID-19 were discussed with the patient and how to seek care for testing (follow up with PCP or arrange E-visit).  ***The importance of social distancing was discussed today.  Time:   Today, I have spent *** minutes with the patient with telehealth technology discussing the above problems.     Medication Adjustments/Labs and Tests Ordered: Current medicines are reviewed at length with the patient today.  Concerns regarding medicines are outlined above.   Tests Ordered: No orders of the defined types were placed in this encounter.   Medication Changes: No orders of the defined types were placed in this encounter.   Follow Up:  {F/U Format:[phone]} {follow up:15908}  Signed, Erma Heritage, PA-C  01/08/2020 12:35 PM    [facility]

## 2020-01-09 ENCOUNTER — Telehealth (INDEPENDENT_AMBULATORY_CARE_PROVIDER_SITE_OTHER): Payer: Medicare Other | Admitting: Student

## 2020-01-09 ENCOUNTER — Encounter: Payer: Self-pay | Admitting: Student

## 2020-01-09 VITALS — BP 136/52 | HR 58 | Temp 96.4°F | Resp 20 | Wt 332.0 lb

## 2020-01-09 DIAGNOSIS — I2510 Atherosclerotic heart disease of native coronary artery without angina pectoris: Secondary | ICD-10-CM

## 2020-01-09 DIAGNOSIS — I714 Abdominal aortic aneurysm, without rupture, unspecified: Secondary | ICD-10-CM

## 2020-01-09 DIAGNOSIS — I4821 Permanent atrial fibrillation: Secondary | ICD-10-CM | POA: Diagnosis not present

## 2020-01-09 DIAGNOSIS — I10 Essential (primary) hypertension: Secondary | ICD-10-CM

## 2020-01-09 DIAGNOSIS — I5032 Chronic diastolic (congestive) heart failure: Secondary | ICD-10-CM | POA: Diagnosis not present

## 2020-01-09 NOTE — Progress Notes (Signed)
Virtual Visit via Telephone Note   This visit type was conducted due to national recommendations for restrictions regarding the COVID-19 Pandemic (e.g. social distancing) in an effort to limit this patient's exposure and mitigate transmission in our community.  Due to his co-morbid illnesses, this patient is at least at moderate risk for complications without adequate follow up.  This format is felt to be most appropriate for this patient at this time.  The patient did not have access to video technology/had technical difficulties with video requiring transitioning to audio format only (telephone).  All issues noted in this document were discussed and addressed.  No physical exam could be performed with this format.  Please refer to the patient's chart for his  consent to telehealth for [facility].   Date:  01/09/2020   ID:  Vincent Pope, DOB [birth year], MRN [id number]  Patient Location: [place] Provider Location: Office  PCP:  Hilbert Corrigan, MD  Cardiologist:  Kate Sable, MD  Electrophysiologist:  None   Evaluation Performed:  Follow-Up Visit  Chief Complaint:  6 month visit  History of Present Illness:    Vincent Pope is a 79 y.o. male with past medical history of permanent atrial fibrillation, HTN, AAA (at 4.1 cm by imaging in 10/2018), chronic diastolic CHF, Stage 3 CKD and presumed CAD based off prior stress testing who presents for 34-month follow-up telehealth visit.  He most recently had a phone visit with Dr. Bronson Ing in 06/2019 and reported having chronic lower extremity edema but denied any acute change in his symptoms. No reports of chest pain or dyspnea on exertion. Was continued on Lopressor 12.5 mg twice daily for rate control along with Xarelto for anticoagulation. Weight was stable at 325 lbs and he was continued on Lasix 40 mg daily.  I initially spoke with Doristine Johns and she reported the patient had overall been doing well.  Vitals have been stable and the only change they have noticed in the past several months is weight has increased by 7 pounds but she does report he has a refrigerator in his room now and feels like he might be consuming more food.  In talking with the patient today, he reports overall doing well and denies any recent chest pain or dyspnea on exertion. No recent palpitations, dizziness or presyncope. He denies any orthopnea or PND. He does have chronic lower extremity edema which has overall been stable. No evidence of active bleeding while on anticoagulation.  The patient does not have symptoms concerning for COVID-19 infection (fever, chills, cough, or new shortness of breath).    Past Medical History:  Diagnosis Date  . AAA (abdominal aortic aneurysm) ([place])   . Abdominal aortic ectasia ([place])   . Atherosclerosis of aorta ([place])   . Atherosclerosis of aorta ([place])   . Atrial fibrillation ([place])   . Benign prostatic hyperplasia   . Cancer [facility])    malignant neoplasm of right kidney per records from [facility]  . Cognitive communication deficit   . Congestive heart failure (CHF) ([place])   . Diverticulitis   . Dysrhythmia    Atrial Fibrillation per records from [facility]  . Hypertension, essential   . Neoplasm of right kidney   . Pneumonia    from records from [facility]  . Thrombocytopenia ([place])   . Vitamin D deficiency    Past Surgical History:  Procedure Laterality Date  . CATARACT EXTRACTION Bilateral   .  LAPAROSCOPIC NEPHRECTOMY Right 08/22/2017   Procedure: LAPAROSCOPIC RADICAL NEPHRECTOMY;  Surgeon: Raynelle Bring, MD;  Location: [facility];  Service: Urology;  Laterality: Right;     Current Meds  Medication Sig  . Amino Acids-Protein Hydrolys (FEEDING SUPPLEMENT, PRO-STAT SUGAR FREE 64,) LIQD Take 30 mLs by mouth 3 (three) times daily with meals.  Marland Kitchen atorvastatin (LIPITOR) 20 MG tablet Take 1 tablet (20 mg total) by mouth  daily.  . baclofen (LIORESAL) 10 MG tablet Take 5 mg by mouth 4 (four) times daily as needed for muscle spasms.  . cyanocobalamin (,VITAMIN B-12,) 1000 MCG/ML injection Inject 1,000 mcg into the muscle every 30 (thirty) days.  . furosemide (LASIX) 40 MG tablet Take 40 mg by mouth daily.  Marland Kitchen lisinopril (ZESTRIL) 10 MG tablet Take 10 mg by mouth daily.  . metoprolol tartrate (LOPRESSOR) 25 MG tablet Take 0.5 tablets (12.5 mg total) by mouth 2 (two) times daily.  . polyethylene glycol (MIRALAX / GLYCOLAX) packet Take 17 g by mouth daily.  . potassium chloride SA (KLOR-CON) 20 MEQ tablet Take 20 mEq by mouth 2 (two) times daily.  . Rivaroxaban (XARELTO) 15 MG TABS tablet Take 1 tablet (15 mg total) by mouth daily.  . tamsulosin (FLOMAX) 0.4 MG CAPS capsule Take 0.4 mg by mouth daily.     Allergies:   Patient has no known allergies.   Social History   Tobacco Use  . Smoking status: Former Research scientist (life sciences)  . Smokeless tobacco: Never Used  Substance Use Topics  . Alcohol use: No  . Drug use: No     Family Hx: The patient's family history includes Cancer in his brother.  ROS:   Please see the history of present illness.     All other systems reviewed and are negative.   Prior CV studies:   The following studies were reviewed today:  NST: 06/2017  Atrial fibrillation seen throughout study.  Defect 1: There is a medium defect of moderate severity present in the basal inferoseptal, basal inferior, mid inferoseptal, mid inferior and apical inferior location.  Findings consistent with prior myocardial infarction with peri-infarct ischemia with additional variable soft tissue attenuation artifact.  This is an intermediate risk study.  Event Monitor: 09/2017  Atrial fibrillation and flutter, average HR 65 bpm. One 3 second pause noted.  Labs/Other Tests and Data Reviewed:    EKG:   An ECG dated 09/20/2018 was personally reviewed today and demonstrated:  atrial fibrillation with  slow-ventricular rate, HR 52.   Recent Labs: 03/31/2019: BUN 50; Creatinine, Ser 1.78; Hemoglobin 13.1; Platelets 162; Potassium 4.3; Sodium 136   Recent Lipid Panel Lab Results  Component Value Date/Time   CHOL 113 09/20/2018 02:30 PM   TRIG 66 09/20/2018 02:30 PM   HDL 39 (L) 09/20/2018 02:30 PM   CHOLHDL 2.9 09/20/2018 02:30 PM   LDLCALC 61 09/20/2018 02:30 PM    Wt Readings from Last 3 Encounters:  01/09/20 (!) 332 lb (150.6 kg)  07/13/19 (!) 325 lb (147.4 kg)  01/15/19 (!) 302 lb (137 kg)     Objective:    Vital Signs:  BP (!) 136/52   Pulse (!) 58   Temp (!) 96.4 F (35.8 C)   Resp 20   Wt (!) 332 lb (150.6 kg)   SpO2 97%   BMI 45.03 kg/m    General: Pleasant male sounding in NAD Psych: Normal affect. Neuro: Alert and oriented X 3.  Lungs:  Resp regular and unlabored while talking on the phone.  ASSESSMENT & PLAN:    1. Permanent Atrial Fibrillation - He denies any recent palpitations and heart rate has been well controlled when checked at SNF. He remains on Lopressor 12.5 mg twice daily. Would not further titrate given heart rate in the 50's at times. - He denies any evidence of active bleeding. Remains on renally-dosed Xarelto 15 mg daily (creatinien clearance right at 51 based off most recent labs in 03/2019 and when adjusted for patient's weight).  2. CAD - he has a history of presumed CAD based off of prior stress testing in 06/2017 showing evidence of prior myocardial infarction as outlined above. He denies any recent chest pain or dyspnea on exertion. He is not overly active at baseline but reports walking down the halls with his walker.   - Continue with risk factor modification at this time. Remains on beta-blocker and statin therapy. Not on ASA given the need for anticoagulation.    3. Chronic Diastolic CHF - He reports having chronic lower extremity edema but denies any recent change in his symptoms. No reports of orthopnea or PND. Continue Lasix 40  mg daily. Reviewed with nursing staff to continue to follow weekly weights as he may require diuretic dose adjustment if weight acutely changes or edema worsens.    4. AAA  - at 4.1 cm by imaging in 10/2018 and previously followed by Vascular Surgery. He has not undergone repeat imaging or had follow-up since 2019. I reviewed this with the patient today and he does not wish to undergo repeat imaging for his AAA.   5. HTN - BP well controlled at 136/52 on most recent check. Continue current regimen with Lisinopril 10 mg daily and Lopressor 12.5 mg twice daily.   COVID-19 Education: The signs and symptoms of COVID-19 were discussed with the patient and how to seek care for testing (follow up with PCP or arrange E-visit).  The importance of social distancing was discussed today.  Time:   Today, I have spent 14 minutes with the patient with telehealth technology discussing the above problems.     Medication Adjustments/Labs and Tests Ordered: Current medicines are reviewed at length with the patient today.  Concerns regarding medicines are outlined above.   Tests Ordered: No orders of the defined types were placed in this encounter.   Medication Changes: No orders of the defined types were placed in this encounter.   Follow Up:  Virtual Visit  in 6 month(s)  Signed, Erma Heritage, PA-C  01/09/2020 2:07 PM    [place]

## 2020-01-09 NOTE — Patient Instructions (Signed)

## 2020-07-04 ENCOUNTER — Other Ambulatory Visit: Payer: Self-pay

## 2020-07-04 DIAGNOSIS — I714 Abdominal aortic aneurysm, without rupture, unspecified: Secondary | ICD-10-CM

## 2020-07-21 ENCOUNTER — Ambulatory Visit (INDEPENDENT_AMBULATORY_CARE_PROVIDER_SITE_OTHER)
Admission: RE | Admit: 2020-07-21 | Discharge: 2020-07-21 | Disposition: A | Discharge status: Home / Self Care | Payer: Medicare Other | Source: Ambulatory Visit

## 2020-07-21 ENCOUNTER — Ambulatory Visit (HOSPITAL_COMMUNITY)
Admission: RE | Admit: 2020-07-21 | Discharge: 2020-07-21 | Disposition: A | Discharge status: Home / Self Care | Payer: Medicare Other | Source: Ambulatory Visit | Attending: Surgery | Admitting: Surgery

## 2020-07-21 ENCOUNTER — Ambulatory Visit (INDEPENDENT_AMBULATORY_CARE_PROVIDER_SITE_OTHER): Payer: Medicare Other | Admitting: Physician Assistant

## 2020-07-21 ENCOUNTER — Other Ambulatory Visit: Payer: Self-pay

## 2020-07-21 VITALS — BP 152/66 | HR 73 | Temp 98.2°F | Resp 20 | Ht 72.0 in | Wt 344.0 lb

## 2020-07-21 DIAGNOSIS — I714 Abdominal aortic aneurysm, without rupture, unspecified: Secondary | ICD-10-CM

## 2020-07-21 DIAGNOSIS — I872 Venous insufficiency (chronic) (peripheral): Secondary | ICD-10-CM | POA: Diagnosis not present

## 2020-07-21 DIAGNOSIS — I723 Aneurysm of iliac artery: Secondary | ICD-10-CM | POA: Diagnosis not present

## 2020-07-21 NOTE — Addendum Note (Signed)
Addended byDoylene Bode on: 07/21/2020 01:14 PM   Modules accepted: Orders

## 2020-07-21 NOTE — Progress Notes (Signed)
Office Note     CC:  follow up Requesting Provider:  Hilbert Corrigan*  HPI: Vincent Pope is a 79 y.o. (Mar 08, 1941) male who presents routine follow-up abdominal aortic aneurysm.  His follow-up was delayed due to the COVID-19.  The patient resides at a local nursing home.  He arrives today in a wheelchair but states he walks with rolling walker.  He denies lower extremity pain, intermittent back or abdominal pain.  Denies rest pain.  Denies lower extremity weakness, hemiparesis, monocular blindness or slurred speech.  He states he has continued to gain weight over time.  He has significant bilateral lower extremity edema with the left lower leg ulcer.  This is dressed with silver Mepilex dressing today.  He states he is getting compressive therapy in the nursing home.  Pt has hx of afib and is on Xarelto.    He is maintained on Lipitor 20 mg daily.  He has hx of CHF.  He also has a hx of BLE edema.  He resides in a SNF at [facility].     Past Medical History:  Diagnosis Date  . AAA (abdominal aortic aneurysm) ([place])   . Abdominal aortic ectasia ([place])   . Atherosclerosis of aorta ([place])   . Atherosclerosis of aorta ([place])   . Atrial fibrillation ([place])   . Benign prostatic hyperplasia   . Cancer [facility])    malignant neoplasm of right kidney per [facility] from [facility]  . Cognitive communication deficit   . Congestive heart failure (CHF) ([place])   . Diverticulitis   . Dysrhythmia    Atrial Fibrillation per records from [facility]  . Hypertension, essential   . Neoplasm of right kidney   . Pneumonia    from records from [facility]  . Thrombocytopenia ([place])   . Vitamin D deficiency     Past Surgical History:  Procedure Laterality Date  . CATARACT EXTRACTION Bilateral   . LAPAROSCOPIC NEPHRECTOMY Right 08/22/2017   Procedure: LAPAROSCOPIC RADICAL NEPHRECTOMY;  Surgeon: Raynelle Bring, MD;  Location[facility];  Service:  Urology;  Laterality: Right;    Social History   Socioeconomic History  . Marital status: Single    Spouse name: Not on file  . Number of children: Not on file  . Years of education: Not on file  . Highest education level: Not on file  Occupational History  . Not on file  Tobacco Use  . Smoking status: Former Research scientist (life sciences)  . Smokeless tobacco: Never Used  Vaping Use  . Vaping Use: Never used  Substance and Sexual Activity  . Alcohol use: No  . Drug use: No  . Sexual activity: Not Currently  Other Topics Concern  . Not on file  Social History Narrative  . Not on file   Social Determinants of Health   Financial Resource Strain:   . Difficulty of Paying Living Expenses:   Food Insecurity:   . Worried About Charity fundraiser in the Last Year:   . Arboriculturist in the Last Year:   Transportation Needs:   . Film/video editor (Medical):   Marland Kitchen Lack of Transportation (Non-Medical):   Physical Activity:   . Days of Exercise per Week:   . Minutes of Exercise per Session:   Stress:   . Feeling of Stress :   Social Connections:   . Frequency of Communication with Friends and Family:   . Frequency of Social Gatherings with Friends  and Family:   . Attends Religious Services:   . Active Member of Clubs or Organizations:   . Attends Archivist Meetings:   Marland Kitchen Marital Status:   Intimate Partner Violence:   . Fear of Current or Ex-Partner:   . Emotionally Abused:   Marland Kitchen Physically Abused:   . Sexually Abused:    Family History  Problem Relation Age of Onset  . Cancer Brother     Current Outpatient Medications  Medication Sig Dispense Refill  . Amino Acids-Protein Hydrolys (FEEDING SUPPLEMENT, PRO-STAT SUGAR FREE 64,) LIQD Take 30 mLs by mouth 3 (three) times daily with meals.    Marland Kitchen atorvastatin (LIPITOR) 20 MG tablet Take 1 tablet (20 mg total) by mouth daily. 90 tablet 3  . baclofen (LIORESAL) 10 MG tablet Take 5 mg by mouth 4 (four) times daily as needed for muscle  spasms.    . cyanocobalamin (,VITAMIN B-12,) 1000 MCG/ML injection Inject 1,000 mcg into the muscle every 30 (thirty) days.    . furosemide (LASIX) 40 MG tablet Take 40 mg by mouth daily.    Marland Kitchen lisinopril (ZESTRIL) 10 MG tablet Take 10 mg by mouth daily.    . metoprolol tartrate (LOPRESSOR) 25 MG tablet Take 0.5 tablets (12.5 mg total) by mouth 2 (two) times daily. 60 tablet 0  . polyethylene glycol (MIRALAX / GLYCOLAX) packet Take 17 g by mouth daily.    . potassium chloride SA (KLOR-CON) 20 MEQ tablet Take 20 mEq by mouth 2 (two) times daily.    . Rivaroxaban (XARELTO) 15 MG TABS tablet Take 1 tablet (15 mg total) by mouth daily. 30 tablet 6  . tamsulosin (FLOMAX) 0.4 MG CAPS capsule Take 0.4 mg by mouth daily.     No current facility-administered medications for this visit.    No Known Allergies   REVIEW OF SYSTEMS:   [X]  denotes positive finding, [ ]  denotes negative finding Cardiac  Comments:  Chest pain or chest pressure:    Shortness of breath upon exertion:    Short of breath when lying flat:    Irregular heart rhythm: x       Vascular    Pain in calf, thigh, or hip brought on by ambulation:    Pain in feet at night that wakes you up from your sleep:     Blood clot in your veins:    Leg swelling:  x       Pulmonary    Oxygen at home:    Productive cough:     Wheezing:         Neurologic    Sudden weakness in arms or legs:     Sudden numbness in arms or legs:     Sudden onset of difficulty speaking or slurred speech:    Temporary loss of vision in one eye:     Problems with dizziness:         Gastrointestinal    Blood in stool:     Vomited blood:         Genitourinary    Burning when urinating:     Blood in urine:        Psychiatric    Major depression:         Hematologic    Bleeding problems:    Problems with blood clotting too easily:        Skin    Rashes or ulcers:        Constitutional    Fever or chills:  PHYSICAL  EXAMINATION:  Vitals:   07/21/20 0959  BP: (!) 152/66  Pulse: 73  Resp: 20  Temp: 98.2 F (36.8 C)  SpO2: 97%   General:  WDWN in NAD; vital signs documented above Gait: Not observed HENT: WNL, normocephalic Pulmonary: normal non-labored breathing , without Rales, rhonchi,  wheezing Cardiac: irregular HR, without  Murmurs without carotid bruit Abdomen: soft, NT, no masses Skin: without rashes Vascular Exam/Pulses: Extremities: with chronic ischemic changes, without Gangrene , without cellulitis; with open wounds;  Musculoskeletal: no muscle wasting or atrophy  Neurologic: A&O X 3;  No focal weakness or paresthesias are detected Psychiatric:  The pt has Normal affect.       Non-Invasive Vascular Imaging:   Duplex aorta/iliacs AAA: 4.11 cm Right CIA: 2.3 Left CIA: 2.3  ABI/TBIToday's ABIToday's TBIPrevious ABIPrevious TBI  +-------+-----------+-----------+------------+------------+  Right 1.16    0.76    0.97    0.71      +-------+-----------+-----------+------------+------------+  Left  0.28    0.42    1.41    0.65      +-------+-----------+-----------+------------+------------+     Summary:  Right: Resting right ankle-brachial index is within normal range. No  evidence of significant right lower extremity arterial disease. The right  toe-brachial index is normal.   Left: Resting left ankle-brachial index is within normal range. No  evidence of significant left lower extremity arterial disease. ABI is  borderline noncompressible, may be falsely elevated. The left toe-brachial  index is abnormal.    ASSESSMENT/PLAN:: 79 y.o. male here for follow up for mid to distal abdominal aortic aneurysm.  No significant change in maximal diameter as compared to January 12, 2019.  No abdominal or back pain.   Bilateral common iliac artery dilatation increased from January 2020.  Continue to monitor. 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  Wound care and compressive therapy continue. He has mild peripheral arterial disease with normal ABIs.  No claudication or rest pain.   Follow-up in 1 year with aorta and iliac duplex as well as ABIs.  Barbie Banner, PA-C [facility] [phone]  Clinic MD:   Trula Slade

## 2020-07-31 ENCOUNTER — Other Ambulatory Visit: Payer: Self-pay

## 2020-07-31 ENCOUNTER — Ambulatory Visit (INDEPENDENT_AMBULATORY_CARE_PROVIDER_SITE_OTHER): Payer: Medicare Other | Admitting: Student

## 2020-07-31 ENCOUNTER — Encounter: Payer: Self-pay | Admitting: Student

## 2020-07-31 VITALS — BP 128/60 | HR 84 | Ht 73.0 in | Wt 339.4 lb

## 2020-07-31 DIAGNOSIS — I714 Abdominal aortic aneurysm, without rupture, unspecified: Secondary | ICD-10-CM

## 2020-07-31 DIAGNOSIS — I4821 Permanent atrial fibrillation: Secondary | ICD-10-CM | POA: Diagnosis not present

## 2020-07-31 DIAGNOSIS — E785 Hyperlipidemia, unspecified: Secondary | ICD-10-CM

## 2020-07-31 DIAGNOSIS — Z79899 Other long term (current) drug therapy: Secondary | ICD-10-CM

## 2020-07-31 DIAGNOSIS — I2510 Atherosclerotic heart disease of native coronary artery without angina pectoris: Secondary | ICD-10-CM | POA: Diagnosis not present

## 2020-07-31 DIAGNOSIS — I10 Essential (primary) hypertension: Secondary | ICD-10-CM

## 2020-07-31 DIAGNOSIS — I5032 Chronic diastolic (congestive) heart failure: Secondary | ICD-10-CM | POA: Diagnosis not present

## 2020-07-31 NOTE — Patient Instructions (Signed)
Medication Instructions:  Your physician recommends that you continue on your current medications as directed. Please refer to the Current Medication list given to you today.  *If you need a refill on your cardiac medications before your next appointment, please call your pharmacy*   Lab Work: None today [facility] PLEASE FAX PATIENT'S MOST RECENT BMET  If you have labs (blood work) drawn today and your tests are completely normal, you will receive your results only by: Marland Kitchen MyChart Message (if you have MyChart) OR . A paper copy in the mail If you have any lab test that is abnormal or we need to change your treatment, we will call you to review the results.   Testing/Procedures: NONE TODAY   Follow-Up: At [facility], you and your health needs are our priority.  As part of our continuing mission to provide you with exceptional heart care, we have created designated Provider Care Teams.  These Care Teams include your primary Cardiologist (physician) and Advanced Practice Providers (APPs -  Physician Assistants and Nurse Practitioners) who all work together to provide you with the care you need, when you need it.  We recommend signing up for the patient portal called "MyChart".  Sign up information is provided on this After Visit Summary.  MyChart is used to connect with patients for Virtual Visits (Telemedicine).  Patients are able to view lab/test results, encounter notes, upcoming appointments, etc.  Non-urgent messages can be sent to your provider as well.   To learn more about what you can do with MyChart, go to [email or web address].    Your next appointment:   12 month(s)  The format for your next appointment:   In Person  Provider:   Carlyle Dolly, MD   Other Instructions NONE       Thank you for choosing [facility] !

## 2020-07-31 NOTE — Progress Notes (Signed)
Cardiology Office Note    Date:  07/31/2020   ID:  Vincent Pope, DOB 1941-12-05, MRN [id number]  PCP:  Hilbert Corrigan, MD  Cardiologist: Kate Sable, MD (Inactive)  --> Will follow-up with Dr. Harl Bowie  Chief Complaint  Patient presents with  . Follow-up    6 month visit    History of Present Illness:    Vincent Pope is a 79 y.o. male with past medical history of permanent atrial fibrillation, HTN, AAA, chronic diastolic CHF, stage III CKD and presumed CAD (based off NST in 2018) who presents to the office today for 22-month follow-up.  He most recently had a phone visit with myself in 12/2019 and denied any recent chest pain or dyspnea on exertion at that time. He was residing at [facility]. He was continued on Xarelto for anticoagulation and Lopressor 12.5 mg twice daily for rate control.   He did follow-up with Vascular Surgery in the interim and doppler studies earlier this month showed his AAA measured at 4.11 cm. Lower extremity arterial dopplers showed normal ABI's and he denied any recent claudication. Was informed to follow-up in 1 year.  In talking with the patient today, he reports overall doing well from a cardiac perspective since his last visit. He resides at [facility] and is typically in a wheelchair a majority of the day. He does use a walker on occasion but his left lower extremity is wrapped due to recurrent blisters and this limits his activity. He denies any recent chest pain or dyspnea on exertion. No recent palpitations, orthopnea or PND. He does have chronic lower extremity edema which is typically more noticeable along his left lower extremity.  He denies any recent melena, hematochezia or hematuria.   Past Medical History:  Diagnosis Date  . AAA (abdominal aortic aneurysm) ([place])   . Abdominal aortic ectasia ([place])   . Atherosclerosis of aorta ([place])   . Atherosclerosis of aorta ([place])   . Atrial fibrillation ([place])   . Benign  prostatic hyperplasia   . Cancer [facility])    malignant neoplasm of right kidney per records from [facility]  . Cognitive communication deficit   . Congestive heart failure (CHF) ([place])   . Diverticulitis   . Dysrhythmia    Atrial Fibrillation per records from [facility]  . Hypertension, essential   . Neoplasm of right kidney   . Pneumonia    from records from [facility]  . Thrombocytopenia ([place])   . Vitamin D deficiency     Past Surgical History:  Procedure Laterality Date  . CATARACT EXTRACTION Bilateral   . LAPAROSCOPIC NEPHRECTOMY Right 08/22/2017   Procedure: LAPAROSCOPIC RADICAL NEPHRECTOMY;  Surgeon: Raynelle Bring, MD;  Location: [facility] ORS;  Service: Urology;  Laterality: Right;    Current Medications: Outpatient Medications Prior to Visit  Medication Sig Dispense Refill  . aspirin 81 MG chewable tablet Chew by mouth daily.    Marland Kitchen atorvastatin (LIPITOR) 20 MG tablet Take 1 tablet (20 mg total) by mouth daily. 90 tablet 3  . cyanocobalamin (,VITAMIN B-12,) 1000 MCG/ML injection Inject 1,000 mcg into the muscle every 30 (thirty) days.    Marland Kitchen DOXYCYCLINE HYCLATE PO Take 100 mg by mouth.    . Emollient (EUCERIN) lotion Apply topically as needed for dry skin.    . ferrous sulfate 325 (65 FE) MG EC tablet Take 325 mg by mouth daily.    . Menthol, Topical Analgesic, (BIOFREEZE) 4 %  GEL Apply topically 2 (two) times daily.    . metoprolol tartrate (LOPRESSOR) 25 MG tablet Take 0.5 tablets (12.5 mg total) by mouth 2 (two) times daily. 60 tablet 0  . polyethylene glycol (MIRALAX / GLYCOLAX) packet Take 17 g by mouth daily.    . potassium chloride SA (KLOR-CON) 20 MEQ tablet Take 20 mEq by mouth 2 (two) times daily.    . Rivaroxaban (XARELTO) 15 MG TABS tablet Take 1 tablet (15 mg total) by mouth daily. 30 tablet 6  . saccharomyces boulardii (FLORASTOR) 250 MG capsule Take 250 mg by mouth 2 (two) times daily.    . simethicone (MYLICON)  532 MG chewable tablet Chew 125 mg by mouth every 6 (six) hours as needed for flatulence.    Marland Kitchen spironolactone (ALDACTONE) 25 MG tablet Take 25 mg by mouth daily.    . tamsulosin (FLOMAX) 0.4 MG CAPS capsule Take 0.4 mg by mouth daily.    Marland Kitchen torsemide (DEMADEX) 100 MG tablet Take 100 mg by mouth daily.    . furosemide (LASIX) 40 MG tablet Take 40 mg by mouth daily.    Marland Kitchen lisinopril (ZESTRIL) 10 MG tablet Take 5 mg by mouth daily.      No facility-administered medications prior to visit.     Allergies:   Patient has no known allergies.   Social History   Socioeconomic History  . Marital status: Single    Spouse name: Not on file  . Number of children: Not on file  . Years of education: Not on file  . Highest education level: Not on file  Occupational History  . Not on file  Tobacco Use  . Smoking status: Former Research scientist (life sciences)  . Smokeless tobacco: Never Used  Vaping Use  . Vaping Use: Never used  Substance and Sexual Activity  . Alcohol use: No  . Drug use: No  . Sexual activity: Not Currently  Other Topics Concern  . Not on file  Social History Narrative  . Not on file   Social Determinants of Health   Financial Resource Strain:   . Difficulty of Paying Living Expenses: Not on file  Food Insecurity:   . Worried About Charity fundraiser in the Last Year: Not on file  . Ran Out of Food in the Last Year: Not on file  Transportation Needs:   . Lack of Transportation (Medical): Not on file  . Lack of Transportation (Non-Medical): Not on file  Physical Activity:   . Days of Exercise per Week: Not on file  . Minutes of Exercise per Session: Not on file  Stress:   . Feeling of Stress : Not on file  Social Connections:   . Frequency of Communication with Friends and Family: Not on file  . Frequency of Social Gatherings with Friends and Family: Not on file  . Attends Religious Services: Not on file  . Active Member of Clubs or Organizations: Not on file  . Attends Theatre manager Meetings: Not on file  . Marital Status: Not on file     Family History:  The patient's family history includes Cancer in his brother.   Review of Systems:   Please see the history of present illness.     General:  No chills, fever, night sweats or weight changes.  Cardiovascular:  No chest pain, dyspnea on exertion, orthopnea, palpitations, paroxysmal nocturnal dyspnea. Positive for edema.  Dermatological: No rash, lesions/masses Respiratory: No cough, dyspnea Urologic: No hematuria, dysuria Abdominal:   No  nausea, vomiting, diarrhea, bright red blood per rectum, melena, or hematemesis Neurologic:  No visual changes, wkns, changes in mental status. All other systems reviewed and are otherwise negative except as noted above.   Physical Exam:    VS:  BP 128/60   Pulse 84   Ht 6\' 1"  (1.854 m)   Wt (!) 339 lb 6.4 oz (154 kg)   SpO2 95%   BMI 44.78 kg/m    General: Well developed, well nourished,male appearing in no acute distress. Head: Normocephalic, atraumatic. Neck: No carotid bruits. JVD not elevated.  Lungs: Respirations regular and unlabored, without wheezes or rales.  Heart: Irregularly irregular. No S3 or S4.  No murmur, no rubs, or gallops appreciated. Abdomen: Appears non-distended. No obvious abdominal masses. Msk:  Strength and tone appear normal for age. No obvious joint deformities or effusions. Extremities: No clubbing or cyanosis. 1+ pitting edema bilaterally. Left lower extremity wrapped.  Distal pedal pulses are 2+ bilaterally. Neuro: Alert and oriented X 3. Moves all extremities spontaneously. No focal deficits noted. Psych:  Responds to questions appropriately with a normal affect. Skin: No rashes or lesions noted  Wt Readings from Last 3 Encounters:  07/31/20 (!) 339 lb 6.4 oz (154 kg)  07/21/20 (!) 344 lb (156 kg)  01/09/20 (!) 332 lb (150.6 kg)     Studies/Labs Reviewed:   EKG:  EKG is ordered today.  The ekg ordered today demonstrates  rate-controlled atrial fibrillation, HR 66 with anterior infarct pattern. No acute ST abnormalities when compared to prior tracings.   Recent Labs: No results found for requested labs within last 8760 hours.   Lipid Panel    Component Value Date/Time   CHOL 113 09/20/2018 1430   TRIG 66 09/20/2018 1430   HDL 39 (L) 09/20/2018 1430   CHOLHDL 2.9 09/20/2018 1430   VLDL 13 09/20/2018 1430   LDLCALC 61 09/20/2018 1430    Additional studies/ records that were reviewed today include:    NST: 06/2017  Atrial fibrillation seen throughout study.  Defect 1: There is a medium defect of moderate severity present in the basal inferoseptal, basal inferior, mid inferoseptal, mid inferior and apical inferior location.  Findings consistent with prior myocardial infarction with peri-infarct ischemia with additional variable soft tissue attenuation artifact.  This is an intermediate risk study.  Event Monitor: 09/2017  Atrial fibrillation and flutter, average HR 65 bpm. One 3 second pause noted.   ABI's: 07/2020 Summary:  Right: Resting right ankle-brachial index is within normal range. No  evidence of significant right lower extremity arterial disease. The right  toe-brachial index is normal.   Left: Resting left ankle-brachial index is within normal range. No  evidence of significant left lower extremity arterial disease. ABI is  borderline noncompressible, may be falsely elevated. The left toe-brachial  index is abnormal.    Carotid Dopplers: 12/2018 Summary:  Right Carotid: Velocities in the right ICA are consistent with a 1-39%  stenosis.   Left Carotid: Velocities in the left ICA are consistent with a 1-39%  stenosis.    Assessment:    1. Permanent atrial fibrillation ([place])   2. Medication management   3. Diastolic CHF, chronic ([place])   4. Coronary artery disease involving native coronary artery of native heart without angina pectoris   5. Essential hypertension   6.  Hyperlipidemia LDL goal <70   7. AAA (abdominal aortic aneurysm) without rupture ([place])      Plan:   In order of problems listed above:  1. Permanent Atrial Fibrillation - He denies any recent palpitations and his heart rate is well-controlled in the 70's to 80's during today's visit. Continue Lopressor 12.5 mg twice daily for rate control. - He remains on Xarelto 15 mg daily for anticoagulation and denies any evidence of active bleeding.  Will request a copy of his most recent BMET to ensure that he is on the correct dosing given his renal function.  2. Chronic Diastolic CHF - He does have chronic lower extremity edema and his left leg is wrapped on examination today. He denies any specific orthopnea or PND. Says that his weight has actually declined by 4 pounds over the past few weeks. Continue Torsemide 100 mg daily. Will request a copy of his most recent BMET (creatinine was at 1.78 in 03/2019).   3. CAD - He has presumed CAD based off of his prior stress test in 2018 which showed findings consistent with a prior infarction with peri-infarct ischemia. Medical therapy was pursued at that time given no recent anginal symptoms. He denies any recent chest pain or dyspnea on exertion. Will continue with medical management at this time. - Continue Lopressor 12.5 mg twice daily and Atorvastatin 20 mg daily. He has been on ASA along with anticoagulation given his history of PAD.  4. HTN - BP is well controlled at 128/60 during today's visit. Continue current medication regimen with Lopressor 12.5 mg twice daily and Spironolactone 25 mg daily.  5. HLD - Recent labs provided from SNF show that his total cholesterol was at 134, HDL 48 and LDL 68. He remains on Atorvastatin 20 mg daily.  6. AAA - Followed by Vascular Surgery. Was stable at 4.11 cm by recent dopplers earlier this month.    Medication Adjustments/Labs and Tests Ordered: Current medicines are reviewed at length with the patient  today.  Concerns regarding medicines are outlined above.  Medication changes, Labs and Tests ordered today are listed in the Patient Instructions below. Patient Instructions  Medication Instructions:  Your physician recommends that you continue on your current medications as directed. Please refer to the Current Medication list given to you today.  *If you need a refill on your cardiac medications before your next appointment, please call your pharmacy*   Lab Work: None today [facility] PLEASE FAX PATIENT'S MOST RECENT BMET  If you have labs (blood work) drawn today and your tests are completely normal, you will receive your results only by: Marland Kitchen MyChart Message (if you have MyChart) OR . A paper copy in the mail If you have any lab test that is abnormal or we need to change your treatment, we will call you to review the results.   Testing/Procedures: NONE TODAY   Follow-Up: At [facility], you and your health needs are our priority.  As part of our continuing mission to provide you with exceptional heart care, we have created designated Provider Care Teams.  These Care Teams include your primary Cardiologist (physician) and Advanced Practice Providers (APPs -  Physician Assistants and Nurse Practitioners) who all work together to provide you with the care you need, when you need it.  We recommend signing up for the patient portal called "MyChart".  Sign up information is provided on this After Visit Summary.  MyChart is used to connect with patients for Virtual Visits (Telemedicine).  Patients are able to view lab/test results, encounter notes, upcoming appointments, etc.  Non-urgent messages can be sent to your provider as well.   To learn more  about what you can do with MyChart, go to [email or web address].    Your next appointment:   12 month(s)  The format for your next appointment:   In Person  Provider:   Carlyle Dolly, MD   Other  Instructions NONE       Thank you for choosing [facility] !         Signed, Erma Heritage, PA-C  07/31/2020 5:04 PM    [place] [address] [address] [place], [place] [address] Phone: [phone] Fax: [phone]

## 2020-08-04 ENCOUNTER — Encounter: Payer: Self-pay | Admitting: Student

## 2021-12-09 ENCOUNTER — Other Ambulatory Visit: Payer: Self-pay | Admitting: Nephrology

## 2021-12-09 ENCOUNTER — Other Ambulatory Visit (HOSPITAL_COMMUNITY): Payer: Self-pay | Admitting: Nephrology

## 2021-12-09 DIAGNOSIS — Z905 Acquired absence of kidney: Secondary | ICD-10-CM

## 2021-12-09 DIAGNOSIS — D638 Anemia in other chronic diseases classified elsewhere: Secondary | ICD-10-CM

## 2021-12-09 DIAGNOSIS — N1832 Chronic kidney disease, stage 3b: Secondary | ICD-10-CM

## 2021-12-09 DIAGNOSIS — N170 Acute kidney failure with tubular necrosis: Secondary | ICD-10-CM

## 2021-12-09 DIAGNOSIS — I129 Hypertensive chronic kidney disease with stage 1 through stage 4 chronic kidney disease, or unspecified chronic kidney disease: Secondary | ICD-10-CM

## 2021-12-28 ENCOUNTER — Other Ambulatory Visit: Payer: Self-pay

## 2021-12-28 ENCOUNTER — Ambulatory Visit (HOSPITAL_COMMUNITY)
Admission: RE | Admit: 2021-12-28 | Discharge: 2021-12-28 | Disposition: A | Discharge status: Home / Self Care | Payer: Medicare Other | Source: Ambulatory Visit | Attending: Nephrology | Admitting: Nephrology

## 2021-12-28 DIAGNOSIS — D638 Anemia in other chronic diseases classified elsewhere: Secondary | ICD-10-CM | POA: Diagnosis present

## 2021-12-28 DIAGNOSIS — N170 Acute kidney failure with tubular necrosis: Secondary | ICD-10-CM | POA: Diagnosis present

## 2021-12-28 DIAGNOSIS — I129 Hypertensive chronic kidney disease with stage 1 through stage 4 chronic kidney disease, or unspecified chronic kidney disease: Secondary | ICD-10-CM

## 2021-12-28 DIAGNOSIS — Z905 Acquired absence of kidney: Secondary | ICD-10-CM

## 2021-12-28 DIAGNOSIS — N1832 Chronic kidney disease, stage 3b: Secondary | ICD-10-CM

## 2022-01-21 NOTE — Progress Notes (Signed)
Cardiology Office Note   Date:  01/22/2022   ID:  Vincent Pope, DOB Mar 09, 1941, MRN [id number]  PCP:  Hilbert Corrigan, MD  Cardiologist:   Kate Sable, MD (Inactive) Referring:  Hilbert Corrigan, MD  Chief Complaint  Patient presents with   Atrial Fibrillation      History of Present Illness: Vincent Pope is a 81 y.o. male who presents for evaluation of permanent atrial fibrillation, HTN, AAA, chronic diastolic CHF, stage III CKD and presumed CAD (based off NST in 2018) who presents for follow up.    The patient lives at [facility].  He gets around with a walker and a wheelchair.  He is limited by his morbid obesity and chronic lower extremity swelling.  He had a history of diastolic dysfunction.  He has renal insufficiency and sees a nephrologist but I do not have any of these records.  We see him because of his permanent atrial fibrillation. The patient denies any new symptoms such as chest discomfort, neck or arm discomfort. There has been no new shortness of breath, PND or orthopnea. There have been no reported palpitations, presyncope or syncope.   On carefully reviewing his meds it does not look like he is still taking spironolactone for torsemide.  Despite this he does not think his breathing is bad and he is certainly not describing limiting shortness of breath.  He has his legs wrapped because of chronic venous stasis and probably venous insufficiency.  I was able to see some nephrology notes.  This was in care everywhere.  He has a history of a nephrectomy as mentioned below.  This was in 2018.  There is a cyst and he apparently has had some hematuria and it was suggested he might need MRI of his left kidney but he has not wanted to do this.  He tells me he would not want dialysis.  Looks like he supposed to still be taking his diuretic but it is unclear to me if he has been doing this.  He does take his anticoagulation.  He did have an abdominal  aortogram to look at his kidney.  This was in January.  There is dilatation of the distal abdominal aorta.  His right common iliac artery and left common iliac artery dilatation.  Past Medical History:  Diagnosis Date   AAA (abdominal aortic aneurysm)    Abdominal aortic ectasia (HCC)    Atherosclerosis of aorta (HCC)    Atrial fibrillation (HCC)    Benign prostatic hyperplasia    Cancer ([place])    malignant neoplasm of right kidney per records from [facility]   Cognitive communication deficit    Congestive heart failure (CHF) ([place])    Diverticulitis    Hypertension, essential    Neoplasm of right kidney    Thrombocytopenia ([place])    Vitamin D deficiency     Past Surgical History:  Procedure Laterality Date   CATARACT EXTRACTION Bilateral    LAPAROSCOPIC NEPHRECTOMY Right 08/22/2017   Procedure: LAPAROSCOPIC RADICAL NEPHRECTOMY;  Surgeon: Raynelle Bring, MD;  Location: [facility];  Service: Urology;  Laterality: Right;     Current Outpatient Medications  Medication Sig Dispense Refill   aspirin 81 MG chewable tablet Chew by mouth daily.     atorvastatin (LIPITOR) 20 MG tablet Take 1 tablet (20 mg total) by mouth daily. 90 tablet 3   calcium carbonate (OS-CAL) 1250 (500 Ca) MG chewable tablet Chew by mouth.     cyanocobalamin (,  VITAMIN B-12,) 1000 MCG/ML injection Inject 1,000 mcg into the muscle every 30 (thirty) days.     Emollient (EUCERIN) lotion Apply topically as needed for dry skin.     ferrous sulfate 325 (65 FE) MG EC tablet Take 325 mg by mouth daily.     furosemide (LASIX) 40 MG tablet Take by mouth.     melatonin 3 MG TABS tablet Take by mouth.     metoprolol tartrate (LOPRESSOR) 25 MG tablet Take 0.5 tablets (12.5 mg total) by mouth 2 (two) times daily. 60 tablet 0   potassium chloride SA (KLOR-CON) 20 MEQ tablet Take 20 mEq by mouth 2 (two) times daily.     Rivaroxaban (XARELTO) 15 MG TABS tablet Take 1 tablet (15 mg total) by mouth daily. 30 tablet 6    simethicone (MYLICON) 326 MG chewable tablet Chew 125 mg by mouth every 6 (six) hours as needed for flatulence.     tamsulosin (FLOMAX) 0.4 MG CAPS capsule Take 0.4 mg by mouth daily.     DOXYCYCLINE HYCLATE PO Take 100 mg by mouth. (Patient not taking: Reported on 01/22/2022)     Menthol, Topical Analgesic, (BIOFREEZE) 4 % GEL Apply topically 2 (two) times daily. (Patient not taking: Reported on 01/22/2022)     polyethylene glycol (MIRALAX / GLYCOLAX) packet Take 17 g by mouth daily. (Patient not taking: Reported on 01/22/2022)     saccharomyces boulardii (FLORASTOR) 250 MG capsule Take 250 mg by mouth 2 (two) times daily. (Patient not taking: Reported on 01/22/2022)     spironolactone (ALDACTONE) 25 MG tablet Take 25 mg by mouth daily. (Patient not taking: Reported on 01/22/2022)     torsemide (DEMADEX) 100 MG tablet Take 100 mg by mouth daily. (Patient not taking: Reported on 01/22/2022)     No current facility-administered medications for this visit.    Allergies:   Patient has no known allergies.   Family History:  The patient's family history includes Cancer in his brother.    ROS:  Please see the history of present illness.   Otherwise, review of systems are positive for none.   All other systems are reviewed and negative.    PHYSICAL EXAM: VS:  BP 105/69    Pulse (!) 49    Ht 6\' 2"  (1.88 m)    Wt (!) 350 lb 6.4 oz (158.9 kg)    SpO2 97%    BMI 44.99 kg/m  , BMI Body mass index is 44.99 kg/m. GEN:  No distress NECK:  No jugular venous distention at 90 degrees, waveform within normal limits, carotid upstroke brisk and symmetric, no bruits, no thyromegaly LYMPHATICS:  No cervical adenopathy LUNGS:  Clear to auscultation bilaterally BACK:  No CVA tenderness CHEST:  Unremarkable HEART:  S1 and S2 within normal limits, no S3, no clicks, no rubs, no murmurs, irregular ABD:  Positive bowel sounds normal in frequency in pitch, no bruits, no rebound, no guarding, unable to assess midline  mass or bruit with the patient seated. EXT:  2 plus pulses throughout, severe bilateral edema, no cyanosis no clubbing SKIN:  No rashes no nodules NEURO:  Cranial nerves II through XII grossly intact, motor grossly intact throughout PSYCH:  Cognitively intact, oriented to person place and time   EKG:  EKG  ordered today. The ekg ordered today demonstrates atrial fibrillation, rate 49, low voltage in the limb and chest leads, poor anterior R wave progression, right axis deviation, no acute ST-T wave changes.   Recent Labs: No  results found for requested labs within last 8760 hours.    Lipid Panel    Component Value Date/Time   CHOL 113 09/20/2018 1430   TRIG 66 09/20/2018 1430   HDL 39 (L) 09/20/2018 1430   CHOLHDL 2.9 09/20/2018 1430   VLDL 13 09/20/2018 1430   LDLCALC 61 09/20/2018 1430      Wt Readings from Last 3 Encounters:  01/22/22 (!) 350 lb 6.4 oz (158.9 kg)  07/31/20 (!) 339 lb 6.4 oz (154 kg)  07/21/20 (!) 344 lb (156 kg)      Other studies Reviewed: Additional studies/ records that were reviewed today include: Nephrology records Review of the above records demonstrates:  Please see elsewhere in the note.     ASSESSMENT AND PLAN:  Permanent Atrial Fibrillation:  The patient has permanent atrial fibrillation and tolerates anticoagulation and is on the appropriate dose.  His heart rate is slow but he has no problems with this.  We will continue the meds as listed.  If he has symptomatic bradycardia in the future we could reduce his beta-blocker.   Chronic Diastolic CHF: I am going to defer to his primary nephrologist to seeing him soon.  He is having very many symptoms and I am not entirely clear he is taking spironolactone or the torsemide.  He will remain on the meds as listed we talked about keeping his feet elevated.  We talked about restricting calories salt and fluid.  CAD: He is having no new chest pain.  He had prior testing in 2018 with prior infarct  noted with peri-infarct ischemia but given his comorbidities he certainly is not a candidate for any further evaluation and thankfully is not having any unstable symptoms.  He will continue the aspirin along with the Xarelto given the probable coronary disease.  HTN: Blood pressure is well controlled.  Continue meds as listed.   HLD: I do not have most recent lipids and we will try to find some outside labs.  AAA: I doubt that he be an interventional candidate.  He did have this just imaged and there were no clear abnormalities that would require further imaging at this time but we will put him down for 1 year abdominal ultrasound if not done in the interim.   Current medicines are reviewed at length with the patient today.  The patient does not have concerns regarding medicines.  The following changes have been made:  no change  Labs/ tests ordered today include: None  Orders Placed This Encounter  Procedures   EKG 12-Lead   VAS [place] AAA DUPLEX     Disposition:   FU with me in one year in [place], Minus Breeding, MD  01/22/2022 10:15 AM    [place]

## 2022-01-22 ENCOUNTER — Ambulatory Visit (INDEPENDENT_AMBULATORY_CARE_PROVIDER_SITE_OTHER): Payer: Medicare Other | Admitting: Cardiology

## 2022-01-22 ENCOUNTER — Encounter: Payer: Self-pay | Admitting: Cardiology

## 2022-01-22 ENCOUNTER — Other Ambulatory Visit: Payer: Self-pay

## 2022-01-22 VITALS — BP 105/69 | HR 49 | Ht 74.0 in | Wt 350.4 lb

## 2022-01-22 DIAGNOSIS — I2510 Atherosclerotic heart disease of native coronary artery without angina pectoris: Secondary | ICD-10-CM | POA: Diagnosis not present

## 2022-01-22 DIAGNOSIS — I4821 Permanent atrial fibrillation: Secondary | ICD-10-CM | POA: Diagnosis not present

## 2022-01-22 DIAGNOSIS — I10 Essential (primary) hypertension: Secondary | ICD-10-CM

## 2022-01-22 DIAGNOSIS — I5032 Chronic diastolic (congestive) heart failure: Secondary | ICD-10-CM

## 2022-01-22 DIAGNOSIS — I7143 Infrarenal abdominal aortic aneurysm, without rupture: Secondary | ICD-10-CM

## 2022-01-22 NOTE — Patient Instructions (Signed)
Medication Instructions:  The current medical regimen is effective;  continue present plan and medications.  *If you need a refill on your cardiac medications before your next appointment, please call your pharmacy*   Testing/Procedures: Your physician has requested that you have an abdominal aorta duplex. During this test, an ultrasound is used to evaluate the aorta. Allow 30 minutes for this exam. Do not eat after midnight the day before and avoid carbonated beverages    Follow-Up: At [facility], you and your health needs are our priority.  As part of our continuing mission to provide you with exceptional heart care, we have created designated Provider Care Teams.  These Care Teams include your primary Cardiologist (physician) and Advanced Practice Providers (APPs -  Physician Assistants and Nurse Practitioners) who all work together to provide you with the care you need, when you need it.  We recommend signing up for the patient portal called "MyChart".  Sign up information is provided on this After Visit Summary.  MyChart is used to connect with patients for Virtual Visits (Telemedicine).  Patients are able to view lab/test results, encounter notes, upcoming appointments, etc.  Non-urgent messages can be sent to your provider as well.   To learn more about what you can do with MyChart, go to [email or web address].    Your next appointment:   12 month(s)  The format for your next appointment:   In Person  Provider:   Minus Breeding, MD ([place] [facility]) {

## 2022-02-04 NOTE — Progress Notes (Signed)
Cardiology Office Note   Date:  02/04/2022   ID:  Vincent Pope, DOB 06/23/1941, MRN [id number]  PCP:  Vincent Corrigan, MD  Cardiologist:   Vincent Sable, MD (Inactive) Referring:  Vincent Corrigan, MD  No chief complaint on file.     History of Present Illness: Vincent Pope is a 81 y.o. male who presents for evaluation of permanent atrial fibrillation, HTN, AAA, chronic diastolic CHF, stage III CKD and presumed CAD (based off NST in 2018) who presents for follow up.    The patient lives at [facility].  He gets around with a walker and a wheelchair.  He is has had diastolic dysfunction and renal insufficiency.  I was trying to explore why he was no longer on 100 mg of torsemide and spironolactone but instead on a low-dose of furosemide.  I have the medication list now from [facility] confirming he is on 40 mg of Lasix.  In exploring this further with the patient he apparently was taken off of this probably because of his renal insufficiency and also because he did not want to be urinating as frequently.  He is adamant that he feels okay.  He is does have some dyspnea with exertion but he says it is because he is 79 and he is 300 pounds.  His chronic lower extremity swelling he says is not causing him a problem.  He gets his legs wrapped routinely at [facility].  He denies any new shortness of breath, PND or orthopnea.  He denies any chest pressure, neck or arm discomfort.  He does not use oxygen.   Past Medical History:  Diagnosis Date   AAA (abdominal aortic aneurysm)    Abdominal aortic ectasia (HCC)    Atherosclerosis of aorta (HCC)    Atrial fibrillation (HCC)    Benign prostatic hyperplasia    Cancer ([place])    malignant neoplasm of right kidney per records from [facility]   Cognitive communication deficit    Congestive heart failure (CHF) ([place])    Diverticulitis    Hypertension, essential    Neoplasm of right kidney     Thrombocytopenia ([place])    Vitamin D deficiency     Past Surgical History:  Procedure Laterality Date   CATARACT EXTRACTION Bilateral    LAPAROSCOPIC NEPHRECTOMY Right 08/22/2017   Procedure: LAPAROSCOPIC RADICAL NEPHRECTOMY;  Surgeon: Raynelle Bring, MD;  Location: [facility];  Service: Urology;  Laterality: Right;     Current Outpatient Medications  Medication Sig Dispense Refill   aspirin 81 MG chewable tablet Chew by mouth daily.     atorvastatin (LIPITOR) 20 MG tablet Take 1 tablet (20 mg total) by mouth daily. 90 tablet 3   calcium carbonate (OS-CAL) 1250 (500 Ca) MG chewable tablet Chew by mouth.     cyanocobalamin (,VITAMIN B-12,) 1000 MCG/ML injection Inject 1,000 mcg into the muscle every 30 (thirty) days.     DOXYCYCLINE HYCLATE PO Take 100 mg by mouth. (Patient not taking: Reported on 01/22/2022)     Emollient (EUCERIN) lotion Apply topically as needed for dry skin.     ferrous sulfate 325 (65 FE) MG EC tablet Take 325 mg by mouth daily.     furosemide (LASIX) 40 MG tablet Take by mouth.     melatonin 3 MG TABS tablet Take by mouth.     Menthol, Topical Analgesic, (BIOFREEZE) 4 % GEL Apply topically 2 (two) times daily. (Patient not taking: Reported on 01/22/2022)  metoprolol tartrate (LOPRESSOR) 25 MG tablet Take 0.5 tablets (12.5 mg total) by mouth 2 (two) times daily. 60 tablet 0   polyethylene glycol (MIRALAX / GLYCOLAX) packet Take 17 g by mouth daily. (Patient not taking: Reported on 01/22/2022)     potassium chloride SA (KLOR-CON) 20 MEQ tablet Take 20 mEq by mouth 2 (two) times daily.     Rivaroxaban (XARELTO) 15 MG TABS tablet Take 1 tablet (15 mg total) by mouth daily. 30 tablet 6   saccharomyces boulardii (FLORASTOR) 250 MG capsule Take 250 mg by mouth 2 (two) times daily. (Patient not taking: Reported on 01/22/2022)     simethicone (MYLICON) 740 MG chewable tablet Chew 125 mg by mouth every 6 (six) hours as needed for flatulence.     spironolactone (ALDACTONE) 25 MG  tablet Take 25 mg by mouth daily. (Patient not taking: Reported on 01/22/2022)     tamsulosin (FLOMAX) 0.4 MG CAPS capsule Take 0.4 mg by mouth daily.     torsemide (DEMADEX) 100 MG tablet Take 100 mg by mouth daily. (Patient not taking: Reported on 01/22/2022)     No current facility-administered medications for this visit.    Allergies:   Patient has no known allergies.   ROS:  Please see the history of present illness.   Otherwise, review of systems are positive for none.   All other systems are reviewed and negative.    PHYSICAL EXAM: VS:  There were no vitals taken for this visit. , BMI There is no height or weight on file to calculate BMI. GEN:  No distress NECK:  No jugular venous distention at 90 degrees, waveform within normal limits, carotid upstroke brisk and symmetric, no bruits, no thyromegaly LYMPHATICS:  No cervical adenopathy LUNGS:  Clear to auscultation bilaterally BACK:  No CVA tenderness CHEST:  Unremarkable HEART:  S1 and S2 within normal limits, no S3,  no clicks, no rubs, no murmurs, irregular ABD:  Positive bowel sounds normal in frequency in pitch, no bruits, no rebound, no guarding, unable to assess midline mass or bruit with the patient seated. EXT:  2 plus pulses throughout, moderate edema, no cyanosis no clubbing SKIN:  No rashes no nodules NEURO:  Cranial nerves II through XII grossly intact, motor grossly intact throughout PSYCH:  Cognitively intact, oriented to person place and time   EKG:  EKG not ordered today.   Recent Labs: No results found for requested labs within last 8760 hours.    Lipid Panel    Component Value Date/Time   CHOL 113 09/20/2018 1430   TRIG 66 09/20/2018 1430   HDL 39 (L) 09/20/2018 1430   CHOLHDL 2.9 09/20/2018 1430   VLDL 13 09/20/2018 1430   LDLCALC 61 09/20/2018 1430      Wt Readings from Last 3 Encounters:  01/22/22 (!) 350 lb 6.4 oz (158.9 kg)  07/31/20 (!) 339 lb 6.4 oz (154 kg)  07/21/20 (!) 344 lb (156 kg)       Other studies Reviewed: Additional studies/ records that were reviewed today include: None Review of the above records demonstrates:     ASSESSMENT AND PLAN:  Permanent Atrial Fibrillation: He tolerates his chronic rhythm.  No change in therapy.   Chronic Diastolic CHF: He does not want to take higher dose diuretics.  He thinks he is doing fine.  He does not like urinating as much.  He watches his salt.  He is breathing fine.  He would let us know if he has any shortness  of breath.  At this point no change in therapy.   CAD: He would not be a candidate for any further invasive therapies.  He is not having any symptoms.   HTN:   The blood pressure is controlled.  No change in therapy.   AAA: He would not be an interventional candidate with his morbid obesity.   Current medicines are reviewed at length with the patient today.  The patient does not have concerns regarding medicines.  The following changes have been made: None  Labs/ tests ordered today include: None  No orders of the defined types were placed in this encounter.    Disposition:   FU with me as needed   Signed, Minus Breeding, MD  02/04/2022 9:03 PM    [facility]

## 2022-02-05 ENCOUNTER — Other Ambulatory Visit: Payer: Self-pay

## 2022-02-05 ENCOUNTER — Ambulatory Visit (INDEPENDENT_AMBULATORY_CARE_PROVIDER_SITE_OTHER): Payer: Medicare Other | Admitting: Cardiology

## 2022-02-05 ENCOUNTER — Encounter: Payer: Self-pay | Admitting: Cardiology

## 2022-02-05 VITALS — BP 134/66 | HR 65 | Ht 73.0 in | Wt 350.0 lb

## 2022-02-05 DIAGNOSIS — E785 Hyperlipidemia, unspecified: Secondary | ICD-10-CM

## 2022-02-05 DIAGNOSIS — I4821 Permanent atrial fibrillation: Secondary | ICD-10-CM | POA: Diagnosis not present

## 2022-02-05 DIAGNOSIS — I2510 Atherosclerotic heart disease of native coronary artery without angina pectoris: Secondary | ICD-10-CM | POA: Diagnosis not present

## 2022-02-05 DIAGNOSIS — I5032 Chronic diastolic (congestive) heart failure: Secondary | ICD-10-CM

## 2022-02-05 DIAGNOSIS — I10 Essential (primary) hypertension: Secondary | ICD-10-CM

## 2022-02-05 NOTE — Patient Instructions (Signed)
Medication Instructions:  No Changes In Medications at this time.  *If you need a refill on your cardiac medications before your next appointment, please call your pharmacy*  Follow-Up: At [facility], you and your health needs are our priority.  As part of our continuing mission to provide you with exceptional heart care, we have created designated Provider Care Teams.  These Care Teams include your primary Cardiologist (physician) and Advanced Practice Providers (APPs -  Physician Assistants and Nurse Practitioners) who all work together to provide you with the care you need, when you need it.  We recommend signing up for the patient portal called "MyChart".  Sign up information is provided on this After Visit Summary.  MyChart is used to connect with patients for Virtual Visits (Telemedicine).  Patients are able to view lab/test results, encounter notes, upcoming appointments, etc.  Non-urgent messages can be sent to your provider as well.   To learn more about what you can do with MyChart, go to [email or web address].    Your next appointment:   AS NEEDED   The format for your next appointment:   In Person  Provider:  Dr. Percival Spanish

## 2023-01-20 ENCOUNTER — Ambulatory Visit (HOSPITAL_COMMUNITY)
Admission: RE | Admit: 2023-01-20 | Discharge: 2023-01-20 | Disposition: A | Discharge status: Home / Self Care | Payer: Medicare Other | Source: Ambulatory Visit | Attending: Cardiology | Admitting: Cardiology

## 2023-01-20 DIAGNOSIS — I7143 Infrarenal abdominal aortic aneurysm, without rupture: Secondary | ICD-10-CM | POA: Diagnosis not present

## 2023-01-28 ENCOUNTER — Other Ambulatory Visit: Payer: Self-pay | Admitting: *Deleted

## 2023-01-28 DIAGNOSIS — I7143 Infrarenal abdominal aortic aneurysm, without rupture: Secondary | ICD-10-CM

## 2023-01-28 NOTE — Progress Notes (Signed)
Amb ref to vascular

## 2023-02-07 ENCOUNTER — Encounter: Payer: Self-pay | Admitting: Surgery

## 2023-02-07 ENCOUNTER — Ambulatory Visit (INDEPENDENT_AMBULATORY_CARE_PROVIDER_SITE_OTHER): Payer: Medicare Other | Admitting: Surgery

## 2023-02-07 VITALS — BP 147/71 | HR 69 | Temp 98.0°F | Resp 20 | Ht 73.0 in | Wt 350.0 lb

## 2023-02-07 DIAGNOSIS — I7143 Infrarenal abdominal aortic aneurysm, without rupture: Secondary | ICD-10-CM | POA: Diagnosis not present

## 2023-02-07 NOTE — Progress Notes (Signed)
Vascular and Vein Specialist of [facility]  Patient name: Vincent Pope MRN: [id number] DOB: 05-Apr-1941 Sex: male   REASON FOR VISIT:    Follow up  Vincent Pope:    [name] is a 82 y.o. male who was last seen in our office in 2021 for follow-up of an abdominal aortic aneurysm.  At that time maximum diameter was 4.1 cm.  Both iliacs measured 2.3 cm.  His most recent ultrasound suggested that it had increased in size to 5.2 cm.  He denies any abdominal pain or back pain.   Patient has a history atrial fibrillation on Xarelto.  He is on a statin for hypercholesterolemia.  He suffers from congestive heart failure.  He has bilateral lower extremity edema.  He has a history of nephrectomy in 2018.  His most recent creatinine was 1.46.  He is stage IIIa renal insufficiency   PAST MEDICAL HISTORY:   Past Medical History:  Diagnosis Date   AAA (abdominal aortic aneurysm) (HCC)    Abdominal aortic ectasia (HCC)    Atherosclerosis of aorta (HCC)    Atrial fibrillation (HCC)    Benign prostatic hyperplasia    Cancer ([place])    malignant neoplasm of right kidney per records from [facility]   Cognitive communication deficit    Congestive heart failure (CHF) ([place])    Diverticulitis    Hypertension, essential    Neoplasm of right kidney    Thrombocytopenia (HCC)    Vitamin D deficiency      FAMILY HISTORY:   Family History  Problem Relation Age of Onset   Cancer Brother     SOCIAL HISTORY:   Social History   Tobacco Use   Smoking status: Former   Smokeless tobacco: Never  Substance Use Topics   Alcohol use: No     ALLERGIES:   No Known Allergies   CURRENT MEDICATIONS:   Current Outpatient Medications  Medication Sig Dispense Refill   aspirin 81 MG chewable tablet Chew by mouth daily.     calcium carbonate (OS-CAL) 1250 (500 Ca) MG chewable tablet Chew by mouth.     cyanocobalamin (,VITAMIN  B-12,) 1000 MCG/ML injection Inject 1,000 mcg into the muscle every 30 (thirty) days.     Emollient (EUCERIN) lotion Apply topically as needed for dry skin.     ferrous sulfate 325 (65 FE) MG EC tablet Take 325 mg by mouth daily.     furosemide (LASIX) 40 MG tablet Take by mouth.     melatonin 3 MG TABS tablet Take by mouth.     Menthol, Topical Analgesic, (BIOFREEZE) 4 % GEL Apply topically 2 (two) times daily.     metoprolol tartrate (LOPRESSOR) 25 MG tablet Take 0.5 tablets (12.5 mg total) by mouth 2 (two) times daily. 60 tablet 0   polyethylene glycol (MIRALAX / GLYCOLAX) packet Take 17 g by mouth daily.     potassium chloride SA (KLOR-CON) 20 MEQ tablet Take 20 mEq by mouth 2 (two) times daily.     Rivaroxaban (XARELTO) 15 MG TABS tablet Take 1 tablet (15 mg total) by mouth daily. 30 tablet 6   saccharomyces boulardii (FLORASTOR) 250 MG capsule Take 250 mg by mouth 2 (two) times daily.     simethicone (MYLICON) 0000000 MG chewable tablet Chew 125 mg by mouth every 6 (six) hours as needed for flatulence.     spironolactone (ALDACTONE) 25 MG tablet Take 25 mg by mouth daily.     tamsulosin (FLOMAX)  0.4 MG CAPS capsule Take 0.4 mg by mouth daily.     torsemide (DEMADEX) 100 MG tablet Take 100 mg by mouth daily.     atorvastatin (LIPITOR) 20 MG tablet Take 1 tablet (20 mg total) by mouth daily. 90 tablet 3   DOXYCYCLINE HYCLATE PO Take 100 mg by mouth. (Patient not taking: Reported on 02/07/2023)     No current facility-administered medications for this visit.    REVIEW OF SYSTEMS:   '[X]'$  denotes positive finding, '[ ]'$  denotes negative finding Cardiac  Comments:  Chest pain or chest pressure:    Shortness of breath upon exertion:    Short of breath when lying flat:    Irregular heart rhythm:        Vascular    Pain in calf, thigh, or hip brought on by ambulation:    Pain in feet at night that wakes you up from your sleep:     Blood clot in your veins:    Leg swelling:         Pulmonary     Oxygen at home:    Productive cough:     Wheezing:         Neurologic    Sudden weakness in arms or legs:     Sudden numbness in arms or legs:     Sudden onset of difficulty speaking or slurred speech:    Temporary loss of vision in one eye:     Problems with dizziness:         Gastrointestinal    Blood in stool:     Vomited blood:         Genitourinary    Burning when urinating:     Blood in urine:        Psychiatric    Major depression:         Hematologic    Bleeding problems:    Problems with blood clotting too easily:        Skin    Rashes or ulcers:        Constitutional    Fever or chills:      PHYSICAL EXAM:   Vitals:   02/07/23 0939  BP: (!) 147/71  Pulse: 69  Resp: 20  Temp: 98 F (36.7 C)  SpO2: 95%  Weight: (!) 350 lb (158.8 kg)  Height: '6\' 1"'$  (1.854 m)    GENERAL: The patient is a well-nourished male, in no acute distress. The vital signs are documented above. CARDIAC: There is a regular rate and rhythm.  PULMONARY: Non-labored respirations ABDOMEN: Soft and non-tender MUSCULOSKELETAL: There are no major deformities or cyanosis. NEUROLOGIC: No focal weakness or paresthesias are detected. SKIN: There are no ulcers or rashes noted. PSYCHIATRIC: The patient has a normal affect.  STUDIES:   I have reviewed his ultrasound with the following findings: Abdominal Aorta: There is evidence of abnormal dilatation of the mid and  distal Abdominal aorta. There is evidence of abnormal dilation of the  Right Common Iliac artery. The largest aortic measurement is 5.2 cm. The  largest aortic diameter has increased  compared to prior exam. Previous diameter measurement was 4.1 cm obtained  on 07/21/2020.   Stenosis: +------------------+-------------+--------------+  Location          Stenosis     Comments        +------------------+-------------+--------------+  Right Common Iliac<50% stenosishigh end range   +------------------+-------------+--------------+  Left Common Iliac <50% stenosishigh end range  +------------------+-------------+--------------+    MEDICAL ISSUES:  AAA: Maximum diameter on ultrasound is 5.2 cm.  I discussed that I need to get better information regarding his aneurysm.  I will order a CT scan to determine if he is an endovascular candidate, and to confirm the true size of his aneurysm.  I will do this without contrast given his renal insufficiency.  He will follow-up with me once this is been completed.  I will also get carotid Dopplers and ABIs as part of his preoperative workup.    Leia Alf, MD, FACS Vascular and Vein Specialists of [facility] [phone] Pager [phone]

## 2023-02-08 ENCOUNTER — Other Ambulatory Visit: Payer: Self-pay | Admitting: *Deleted

## 2023-02-08 DIAGNOSIS — Z01818 Encounter for other preprocedural examination: Secondary | ICD-10-CM

## 2023-02-14 ENCOUNTER — Other Ambulatory Visit: Payer: Self-pay

## 2023-02-14 DIAGNOSIS — I7143 Infrarenal abdominal aortic aneurysm, without rupture: Secondary | ICD-10-CM

## 2023-02-16 ENCOUNTER — Ambulatory Visit (INDEPENDENT_AMBULATORY_CARE_PROVIDER_SITE_OTHER)
Admission: RE | Admit: 2023-02-16 | Discharge: 2023-02-16 | Disposition: A | Discharge status: Home / Self Care | Payer: Medicare Other | Source: Ambulatory Visit | Attending: Vascular Surgery | Admitting: Vascular Surgery

## 2023-02-16 ENCOUNTER — Ambulatory Visit (HOSPITAL_COMMUNITY)
Admission: RE | Admit: 2023-02-16 | Discharge: 2023-02-16 | Disposition: A | Discharge status: Home / Self Care | Payer: Medicare Other | Source: Ambulatory Visit | Attending: Vascular Surgery | Admitting: Vascular Surgery

## 2023-02-16 DIAGNOSIS — Z01818 Encounter for other preprocedural examination: Secondary | ICD-10-CM | POA: Diagnosis present

## 2023-02-16 LAB — VAS US ABI WITH/WO TBI
Left ABI: UNDETERMINED
Right ABI: UNDETERMINED

## 2023-02-18 NOTE — Progress Notes (Signed)
Cardiology Clinic Note   Patient Name: Vincent Pope Date of Encounter: 02/25/2023  Primary Care Provider:  Hilbert Corrigan, MD Primary Cardiologist:  Kate Sable, MD (Inactive)/Dr. Hochrein   Patient Profile    82 year old male with known history of permanent atrial fibrillation, chronic diastolic heart failure, hypertension, AAA, and stage III chronic kidney disease.  CHA2DS2-VASc score of 3.  Last seen in the office by Dr. Percival Spanish on 02/05/2022.    He is followed by Dr. Trula Slade for AAA.  Most recent measurement is 5.2 cm, and has increased from 4.1 in 07/21/2020.  A follow-up CT scan to help determine if he was an endovascular candidate was ordered.  This is scheduled for 02-22-2023. CHF History: 1 HTN History: 1 Age Score: 2 Gender Score: 0    CT angio of the abdomen w/wo 02/23/2023 1. Bilobed aneurysm of the infrarenal abdominal aorta with the proximal lobulation measuring 4.3 x 3.8 cm and the distal lobulation measuring 4.5 x 3.9 cm. These have mildly increased in size compared to prior examination from 06/21/2018. 2. Focal dilatation of the right common iliac artery with displaced calcified plaque suspicious for non flow-limiting dissection. Maximum diameter of the right common iliac artery at this site measures 1.7 cm. 3. 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 4. New 1.6 x 1.2 x 1.5 cm solid lesion in the pancreatic head is suspicious for primary pancreatic neoplasm. Further evaluation with contrast-enhanced MRI of the abdomen is recommended.  Past Medical History    Past Medical History:  Diagnosis Date   AAA (abdominal aortic aneurysm) ([place])    Abdominal aortic ectasia (HCC)    Atherosclerosis of aorta (HCC)    Atrial fibrillation ([place])    Benign prostatic hyperplasia    Cancer ([place])    malignant neoplasm of right kidney per records from [facility]   Cognitive communication  deficit    Congestive heart failure (CHF) ([place])    Diverticulitis    Hypertension, essential    Neoplasm of right kidney    Thrombocytopenia ([place])    Vitamin D deficiency    Past Surgical History:  Procedure Laterality Date   CATARACT EXTRACTION Bilateral    LAPAROSCOPIC NEPHRECTOMY Right 08/22/2017   Procedure: LAPAROSCOPIC RADICAL NEPHRECTOMY;  Surgeon: Raynelle Bring, MD;  Location: [facility]S;  Service: Urology;  Laterality: Right;    Allergies  No Known Allergies  History of Present Illness    82 year old male we are following for ongoing assessment and management of permanent atrial fibrillation, hypertension, AAA of the infra renal artery, with slight increase per CT scan completed on 99991111, chronic diastolic heart failure, stage III chronic kidney disease, with new finding on CT scan of lesion on the pancreatic head, suspicious for neoplasm.  The patient is wheelchair-bound living at [facility].  He has chronic venous insufficiency with dependent edema, and gets his legs wrapped routinely at [facility].  Seen most recently on 02/05/2022 by Dr. Percival Spanish.  He was documented is not a candidate for any further invasive therapies concerning coronary artery disease and he was asymptomatic at the time.  He is followed by Dr. Trula Slade for peripheral arterial disease.  He comes today with caregiver sitting in wheelchair.  He denies any chest discomfort, PND or orthopnea.  He is not active.  He sees the wound care nurse where he has his legs wrapped periodically due to venous insufficiency, and has no complaints of lower extremity pain.  He has not yet had all of his antihypertensives this morning prior to the office visit indicated by elevated blood pressure.  He has not had any diuretic this morning he does not adhere to a low-sodium diet but does eat what is provided to him from [facility].  Home Medications    Current Outpatient Medications  Medication Sig Dispense Refill   aspirin  81 MG chewable tablet Chew by mouth daily.     calcium carbonate (OS-CAL) 1250 (500 Ca) MG chewable tablet Chew by mouth.     cyanocobalamin (,VITAMIN B-12,) 1000 MCG/ML injection Inject 1,000 mcg into the muscle every 30 (thirty) days.     DOXYCYCLINE HYCLATE PO Take 100 mg by mouth.     Emollient (EUCERIN) lotion Apply topically as needed for dry skin.     ferrous sulfate 325 (65 FE) MG EC tablet Take 325 mg by mouth daily.     furosemide (LASIX) 40 MG tablet Take by mouth.     melatonin 3 MG TABS tablet Take by mouth.     Menthol, Topical Analgesic, (BIOFREEZE) 4 % GEL Apply topically 2 (two) times daily.     metoprolol tartrate (LOPRESSOR) 25 MG tablet Take 0.5 tablets (12.5 mg total) by mouth 2 (two) times daily. 60 tablet 0   polyethylene glycol (MIRALAX / GLYCOLAX) packet Take 17 g by mouth daily.     potassium chloride SA (KLOR-CON) 20 MEQ tablet Take 20 mEq by mouth 2 (two) times daily.     Rivaroxaban (XARELTO) 15 MG TABS tablet Take 1 tablet (15 mg total) by mouth daily. 30 tablet 6   saccharomyces boulardii (FLORASTOR) 250 MG capsule Take 250 mg by mouth 2 (two) times daily.     simethicone (MYLICON) 0000000 MG chewable tablet Chew 125 mg by mouth every 6 (six) hours as needed for flatulence.     spironolactone (ALDACTONE) 25 MG tablet Take 25 mg by mouth daily.     tamsulosin (FLOMAX) 0.4 MG CAPS capsule Take 0.4 mg by mouth daily.     torsemide (DEMADEX) 100 MG tablet Take 100 mg by mouth daily.     atorvastatin (LIPITOR) 20 MG tablet Take 1 tablet (20 mg total) by mouth daily. 90 tablet 3   No current facility-administered medications for this visit.     Family History    Family History  Problem Relation Age of Onset   Cancer Brother    He indicated that his mother is deceased. He indicated that his father is deceased. He indicated that his brother is alive.  Social History    Social History   Socioeconomic History   Marital status: Single    Spouse name: Not on file    Number of children: Not on file   Years of education: Not on file   Highest education level: Not on file  Occupational History   Not on file  Tobacco Use   Smoking status: Former   Smokeless tobacco: Never  Vaping Use   Vaping Use: Never used  Substance and Sexual Activity   Alcohol use: No   Drug use: No   Sexual activity: Not Currently  Other Topics Concern   Not on file  Social History Narrative   Not on file   Social Determinants of Health   Financial Resource Strain: Not on file  Food Insecurity: Not on file  Transportation Needs: Not on file  Physical Activity: Not on file  Stress: Not on file  Social Connections: Not on file  Intimate Partner Violence: Not on file     Review of Systems    General:  No chills, fever, night sweats or weight changes.  Cardiovascular:  No chest pain, dyspnea on exertion, edema, orthopnea, palpitations, paroxysmal nocturnal dyspnea. Dermatological: No rash, lesions/masses Respiratory: No cough, dyspnea Urologic: No hematuria, dysuria Abdominal:   No nausea, vomiting, diarrhea, bright red blood per rectum, melena, or hematemesis Neurologic:  No visual changes, wkns, changes in mental status. All other systems reviewed and are otherwise negative except as noted above.     Physical Exam    VS:  BP (!) 173/58   Pulse (!) 52   Ht 6\' 1"  (1.854 m)   Wt (!) 334 lb 3.2 oz (151.6 kg)   SpO2 91%   BMI 44.09 kg/m  , BMI Body mass index is 44.09 kg/m.     GEN: Well nourished, well developed, in no acute distress.  Obese sitting in a wheelchair. HEENT: normal. Neck: Supple, no JVD, carotid bruits, or masses. Cardiac: Irregular RRR, distant heart sounds, bradycardic, no murmurs, rubs, or gallops. No clubbing, cyanosis, edema.  Radials/DP/PT 2+ and equal bilaterally.  Respiratory:  Respirations regular and unlabored, clear to auscultation bilaterally. GI: Soft, nontender, nondistended, BS + x 4. MS: no deformity or atrophy.  Bilateral  lower extremities to the knee wrapped in Ace wraps. Skin: warm and dry, no rash. Neuro:  Strength and sensation are intact. Psych: Normal affect.  Accessory Clinical Findings    ECG personally reviewed by me today atrial fibrillation with slow ventricular response heart rate 52 bpm, left axis deviation is noted.  Pulmonary disease pattern per EKG calculation.  Lab Results  Component Value Date   WBC 6.5 03/31/2019   HGB 13.1 03/31/2019   HCT 40.8 03/31/2019   MCV 95.8 03/31/2019   PLT 162 03/31/2019   Lab Results  Component Value Date   CREATININE 1.60 (H) 02/22/2023   BUN 50 (H) 03/31/2019   NA 136 03/31/2019   K 4.3 03/31/2019   CL 104 03/31/2019   CO2 22 03/31/2019   Lab Results  Component Value Date   ALT 14 10/11/2018   AST 15 10/11/2018   ALKPHOS 85 10/11/2018   BILITOT 0.9 10/11/2018   Lab Results  Component Value Date   CHOL 113 09/20/2018   HDL 39 (L) 09/20/2018   LDLCALC 61 09/20/2018   TRIG 66 09/20/2018   CHOLHDL 2.9 09/20/2018    No results found for: "HGBA1C"  Review of Prior Studies: CT of the Abdomen and Pelvis with Contrast 02/22/2023 1. Bilobed aneurysm of the infrarenal abdominal aorta with the proximal lobulation measuring 4.3 x 3.8 cm and the distal lobulation measuring 4.5 x 3.9 cm. These have mildly increased in size compared to prior examination from 06/21/2018. 2. Focal dilatation of the right common iliac artery with displaced calcified plaque suspicious for non flow-limiting dissection. Maximum diameter of the right common iliac artery at this site measures 1.7 cm. 3. 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 4. New 1.6 x 1.2 x 1.5 cm solid lesion in the pancreatic head is suspicious for primary pancreatic neoplasm. Further evaluation with contrast-enhanced MRI of the abdomen is recommended. 5. 1.2 x 1.0 cm hyperenhancing nodule in the left adrenal body is new since prior  examinations and requires further evaluation with adrenal mass protocol CT or MRI as it could represent metastatic disease. 6. Status post right nephrectomy. No evidence of local recurrence.  7. Extensive sigmoid colon diverticulosis. 8. Cholelithiasis.  Assessment & Plan   1.  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  Blood pressure is elevated.  He has not had any diuretic this morning.  He will continue on spironolactone, metoprolol, atorvastatin.  Consider adding amlodipine if blood pressure is still not controlled on follow-up visits.  I am having a CMET drawn through [facility] and have results faxed to [place] to evaluate his status on medication regimen.  It is noted that he has had a right nephrectomy.  2.  Atrial fibrillation: Heart rate is well-controlled currently on metoprolol 12.5 mg twice daily.  He remains on rivaroxaban 15 mg daily.  No issues with bleeding or hemoptysis.  No changes in regimen.  CBC is ordered.  3.  Hyperlipidemia: Remains on statin therapy with atorvastatin.  I have ordered follow-up lipid studies.  He has occlusions of right common iliac artery as well as left main renal artery due to calcified plaque.  Will need to keep cholesterol well-controlled with LDL less than 50.  4.  New lesion of pancreatic head: Found on CT scan of the abdomen suspicious for primary pancreatic neoplasm.  He was also noted to have adrenal gland nodule as well.  Should be followed by PCP at their discretion.  Due to patient's age shared decision making concerning further interventions and testing should be discussed.  Current medicines are reviewed at length with the patient today.  I have spent 25 min's  dedicated to the care of this patient on the date of this encounter to include pre-visit review of records, assessment, management and diagnostic testing,with shared decision making. Signed, Phill Myron. [name], ANP, [place]   02/25/2023 12:14 PM      Office [phone] Fax [phone]  Notice: This dictation was prepared with Dragon dictation along with smaller phrase technology. Any transcriptional errors that result from this process are unintentional and may not be corrected upon review.

## 2023-02-22 ENCOUNTER — Ambulatory Visit (HOSPITAL_COMMUNITY)
Admission: RE | Admit: 2023-02-22 | Discharge: 2023-02-22 | Disposition: A | Discharge status: Home / Self Care | Payer: Medicare Other | Source: Ambulatory Visit | Attending: Surgery | Admitting: Surgery

## 2023-02-22 ENCOUNTER — Encounter (HOSPITAL_COMMUNITY): Payer: Self-pay

## 2023-02-22 ENCOUNTER — Other Ambulatory Visit: Payer: Self-pay

## 2023-02-22 DIAGNOSIS — I7143 Infrarenal abdominal aortic aneurysm, without rupture: Secondary | ICD-10-CM

## 2023-02-22 DIAGNOSIS — C641 Malignant neoplasm of right kidney, except renal pelvis: Secondary | ICD-10-CM | POA: Insufficient documentation

## 2023-02-22 LAB — POCT I-STAT CREATININE: Creatinine, Ser: 1.6 mg/dL — ABNORMAL HIGH (ref 0.61–1.24)

## 2023-02-22 MED ORDER — IOHEXOL 350 MG/ML SOLN
100.0000 mL | Freq: Once | INTRAVENOUS | Status: AC | PRN
  Administered 2023-02-22: 75 mL via INTRAVENOUS

## 2023-02-22 MED ORDER — SODIUM CHLORIDE (PF) 0.9 % IJ SOLN
INTRAMUSCULAR | Status: AC
  Filled 2023-02-22: qty 50

## 2023-02-25 ENCOUNTER — Ambulatory Visit: Payer: Medicare Other | Attending: Adult Health | Admitting: Adult Health

## 2023-02-25 ENCOUNTER — Encounter: Payer: Self-pay | Admitting: Adult Health

## 2023-02-25 VITALS — BP 173/58 | HR 52 | Ht 73.0 in | Wt 334.2 lb

## 2023-02-25 DIAGNOSIS — I7143 Infrarenal abdominal aortic aneurysm, without rupture: Secondary | ICD-10-CM

## 2023-02-25 DIAGNOSIS — K869 Disease of pancreas, unspecified: Secondary | ICD-10-CM | POA: Diagnosis not present

## 2023-02-25 DIAGNOSIS — I10 Essential (primary) hypertension: Secondary | ICD-10-CM

## 2023-02-25 DIAGNOSIS — I4821 Permanent atrial fibrillation: Secondary | ICD-10-CM | POA: Diagnosis not present

## 2023-02-25 NOTE — Patient Instructions (Signed)
Medication Instructions:  No Changes *If you need a refill on your cardiac medications before your next appointment, please call your pharmacy*   Lab Work: No Labs If you have labs (blood work) drawn today and your tests are completely normal, you will receive your results only by: [place] (if you have MyChart) OR A paper copy in the mail If you have any lab test that is abnormal or we need to change your treatment, we will call you to review the results.   Testing/Procedures: No Testing   Follow-Up: At [facility], you and your health needs are our priority.  As part of our continuing mission to provide you with exceptional heart care, we have created designated Provider Care Teams.  These Care Teams include your primary Cardiologist (physician) and Advanced Practice Providers (APPs -  Physician Assistants and Nurse Practitioners) who all work together to provide you with the care you need, when you need it.  We recommend signing up for the patient portal called "MyChart".  Sign up information is provided on this After Visit Summary.  MyChart is used to connect with patients for Virtual Visits (Telemedicine).  Patients are able to view lab/test results, encounter notes, upcoming appointments, etc.  Non-urgent messages can be sent to your provider as well.   To learn more about what you can do with MyChart, go to [email or web address].    Your next appointment:   6 month(s)  Provider:   Minus Breeding, MD

## 2023-02-28 ENCOUNTER — Ambulatory Visit (INDEPENDENT_AMBULATORY_CARE_PROVIDER_SITE_OTHER): Payer: Medicare Other | Admitting: Surgery

## 2023-02-28 ENCOUNTER — Encounter: Payer: Self-pay | Admitting: Surgery

## 2023-02-28 VITALS — BP 162/72 | HR 73 | Temp 97.2°F | Resp 20 | Ht 73.0 in | Wt 342.0 lb

## 2023-02-28 DIAGNOSIS — I7143 Infrarenal abdominal aortic aneurysm, without rupture: Secondary | ICD-10-CM

## 2023-02-28 NOTE — Progress Notes (Signed)
Vascular and Vein Specialist of [facility]  Patient name: Vincent Pope MRN: [id number] DOB: [birth year] Sex: male   REASON FOR VISIT:    Follow up  [place] ILLNESS:    Vincent Pope is a 82 y.o. male who I saw a few weeks ago for follow-up of his abdominal aortic aneurysm.  He had not been seen since 2001.  At that time his aortic diameter was 4.1 cm.  He had an ultrasound suggested increase in size to 5.2 cm.  Therefore I sent him for CT scan.  He is back today to discuss his results.  He denies any abdominal pain or back pain.  Patient has a history atrial fibrillation on Xarelto. He is on a statin for hypercholesterolemia. He suffers from congestive heart failure. He has bilateral lower extremity edema. He has a history of nephrectomy in 2018. His most recent creatinine was 1.46. He is stage IIIa renal insufficiency  PAST MEDICAL HISTORY:   Past Medical History:  Diagnosis Date   AAA (abdominal aortic aneurysm) (HCC)    Abdominal aortic ectasia (HCC)    Atherosclerosis of aorta (HCC)    Atrial fibrillation (HCC)    Benign prostatic hyperplasia    Cancer ([place])    malignant neoplasm of right kidney per records from [facility]   Cognitive communication deficit    Congestive heart failure (CHF) ([place])    Diverticulitis    Hypertension, essential    Neoplasm of right kidney    Thrombocytopenia (HCC)    Vitamin D deficiency      FAMILY HISTORY:   Family History  Problem Relation Age of Onset   Cancer Brother     SOCIAL HISTORY:   Social History   Tobacco Use   Smoking status: Former   Smokeless tobacco: Never  Substance Use Topics   Alcohol use: No     ALLERGIES:   No Known Allergies   CURRENT MEDICATIONS:   Current Outpatient Medications  Medication Sig Dispense Refill   aspirin 81 MG chewable tablet Chew by mouth daily.     calcium carbonate (OS-CAL) 1250 (500 Ca) MG chewable tablet Chew  by mouth.     cyanocobalamin (,VITAMIN B-12,) 1000 MCG/ML injection Inject 1,000 mcg into the muscle every 30 (thirty) days.     DOXYCYCLINE HYCLATE PO Take 100 mg by mouth.     Emollient (EUCERIN) lotion Apply topically as needed for dry skin.     ferrous sulfate 325 (65 FE) MG EC tablet Take 325 mg by mouth daily.     furosemide (LASIX) 40 MG tablet Take by mouth.     melatonin 3 MG TABS tablet Take by mouth.     Menthol, Topical Analgesic, (BIOFREEZE) 4 % GEL Apply topically 2 (two) times daily.     metoprolol tartrate (LOPRESSOR) 25 MG tablet Take 0.5 tablets (12.5 mg total) by mouth 2 (two) times daily. 60 tablet 0   polyethylene glycol (MIRALAX / GLYCOLAX) packet Take 17 g by mouth daily.     potassium chloride SA (KLOR-CON) 20 MEQ tablet Take 20 mEq by mouth 2 (two) times daily.     Rivaroxaban (XARELTO) 15 MG TABS tablet Take 1 tablet (15 mg total) by mouth daily. 30 tablet 6   saccharomyces boulardii (FLORASTOR) 250 MG capsule Take 250 mg by mouth 2 (two) times daily.     simethicone (MYLICON) 0000000 MG chewable tablet Chew 125 mg by mouth every 6 (six) hours as needed for flatulence.  spironolactone (ALDACTONE) 25 MG tablet Take 25 mg by mouth daily.     tamsulosin (FLOMAX) 0.4 MG CAPS capsule Take 0.4 mg by mouth daily.     torsemide (DEMADEX) 100 MG tablet Take 100 mg by mouth daily.     atorvastatin (LIPITOR) 20 MG tablet Take 1 tablet (20 mg total) by mouth daily. 90 tablet 3   No current facility-administered medications for this visit.    REVIEW OF SYSTEMS:   [X]  denotes positive finding, [ ]  denotes negative finding Cardiac  Comments:  Chest pain or chest pressure:    Shortness of breath upon exertion:    Short of breath when lying flat:    Irregular heart rhythm:        Vascular    Pain in calf, thigh, or hip brought on by ambulation:    Pain in feet at night that wakes you up from your sleep:     Blood clot in your veins:    Leg swelling:         Pulmonary     Oxygen at home:    Productive cough:     Wheezing:         Neurologic    Sudden weakness in arms or legs:     Sudden numbness in arms or legs:     Sudden onset of difficulty speaking or slurred speech:    Temporary loss of vision in one eye:     Problems with dizziness:         Gastrointestinal    Blood in stool:     Vomited blood:         Genitourinary    Burning when urinating:     Blood in urine:        Psychiatric    Major depression:         Hematologic    Bleeding problems:    Problems with blood clotting too easily:        Skin    Rashes or ulcers:        Constitutional    Fever or chills:      PHYSICAL EXAM:   Vitals:   02/28/23 0942 02/28/23 0945  BP: (!) 162/74 (!) 162/72  Pulse: 73   Resp: 20   Temp: (!) 97.2 F (36.2 C)   SpO2: 94%   Weight: (!) 342 lb (155.1 kg)   Height: 6\' 1"  (1.854 m)     GENERAL: The patient is a well-nourished male, in no acute distress. The vital signs are documented above. CARDIAC: There is a regular rate and rhythm.  PULMONARY: Non-labored respirations ABDOMEN: Soft and non-tender.  MUSCULOSKELETAL: There are no major deformities or cyanosis. NEUROLOGIC: No focal weakness or paresthesias are detected. SKIN: There are no ulcers or rashes noted. PSYCHIATRIC: The patient has a normal affect.  STUDIES:   I have reviewed his CT scan with the following findings:  1. Stable exam. No new or progressive interval findings. Specifically, no findings to suggest local recurrence or metastatic disease in this patient with a history of right renal cell carcinoma. 2. Stable appearance of the 10 mm hypodense lesion lower pole left kidney with no change in the exophytic interpolar left renal lesion, likely a cyst. 3. 4.1 cm infrarenal abdominal aortic aneurysm. Recommend followup by [place] in 1 year. This recommendation follows ACR consensus guidelines: [name] on Vascular Findings.  [name] 2013; 10:789-794. 4. Cholelithiasis. 5. Stable  appearance exophytic large right thyroid nodule.    MEDICAL ISSUES:   AAA: Maximum aortic diameter by CT scan is 4.1 cm, I will have him follow-up in 6 months with repeat ultrasound  Pancreatic/adrenal mass: I discussed these findings with the patient.  He needs to have these further evaluated.  I am making a stat referral to GI.    Leia Alf, MD, FACS Vascular and Vein Specialists of [facility] [phone] Pager [phone]

## 2023-03-01 ENCOUNTER — Other Ambulatory Visit: Payer: Self-pay

## 2023-03-01 ENCOUNTER — Encounter: Payer: Self-pay | Admitting: Internal Medicine

## 2023-03-01 DIAGNOSIS — I7143 Infrarenal abdominal aortic aneurysm, without rupture: Secondary | ICD-10-CM

## 2023-03-01 LAB — LAB REPORT - SCANNED: EGFR: 49

## 2023-03-08 ENCOUNTER — Encounter: Payer: Self-pay | Admitting: Internal Medicine

## 2023-03-08 ENCOUNTER — Ambulatory Visit: Payer: Medicare Other | Admitting: Internal Medicine

## 2023-03-10 ENCOUNTER — Telehealth: Payer: Self-pay

## 2023-03-10 NOTE — Telephone Encounter (Addendum)
Called patient regarding results. Unable to leave message.----- Message from Lendon Colonel, NP sent at 03/08/2023  3:11 PM EDT ----- I have reviewed his recent labs scanned from [facility]. No significant concerns are found. Continue regimen as he is on at [facility].

## 2023-03-16 ENCOUNTER — Telehealth: Payer: Self-pay

## 2023-03-16 NOTE — Telephone Encounter (Addendum)
Called patient regarding results. Letter mailed 03/16/23----- Message from Lendon Colonel, NP sent at 03/08/2023  3:11 PM EDT ----- I have reviewed his recent labs scanned from [facility]. No significant concerns are found. Continue regimen as he is on at [facility].

## 2023-03-16 NOTE — Telephone Encounter (Signed)
-----   Message from Lendon Colonel, NP sent at 03/08/2023  3:11 PM EDT ----- I have reviewed his recent labs scanned from [facility]. No significant concerns are found. Continue regimen as he is on at [facility].

## 2023-05-20 ENCOUNTER — Ambulatory Visit (INDEPENDENT_AMBULATORY_CARE_PROVIDER_SITE_OTHER): Payer: Medicare Other | Admitting: Internal Medicine

## 2023-05-20 ENCOUNTER — Encounter: Payer: Self-pay | Admitting: Internal Medicine

## 2023-05-20 VITALS — BP 150/68 | HR 60 | Ht 74.0 in | Wt 343.0 lb

## 2023-05-20 DIAGNOSIS — E279 Disorder of adrenal gland, unspecified: Secondary | ICD-10-CM

## 2023-05-20 DIAGNOSIS — K8689 Other specified diseases of pancreas: Secondary | ICD-10-CM | POA: Diagnosis not present

## 2023-05-20 NOTE — Progress Notes (Signed)
Chief Complaint: Pancreas mass  HPI : 82 year old male with history of A-fib on Xarelto, HFpEF, AAA, CKD, RCC s/p right nephrectomy, obesity, COPD, prior diverticulitis, gallstones, and GERD presents with a pancreas mass  Denies any GI symptoms. Denies hematochezia, melena, dysphagia, nausea, vomiting, abdominal pain, diarrhea, constipation, weight loss. Denies prior EGD or colonoscopy. Brother had colon cancer in his 24s. He is on the blood thinner Xarelto.  He currently lives at an assisted living facility called [facility] nursing.  He makes his own medical decisions.  On a recent CT scan he was noted to have a pancreatic and adrenal mass.  Wt Readings from Last 3 Encounters:  05/20/23 (!) 343 lb (155.6 kg)  02/28/23 (!) 342 lb (155.1 kg)  02/25/23 (!) 334 lb 3.2 oz (151.6 kg)   Past Medical History:  Diagnosis Date   AAA (abdominal aortic aneurysm) (HCC)    Abdominal aortic ectasia (HCC)    Anemia    Atherosclerosis of aorta (HCC)    Atrial fibrillation (HCC)    Benign prostatic hyperplasia    Cancer (HCC)    malignant neoplasm of right kidney per records from [facility]   CKD (chronic kidney disease)    Cognitive communication deficit    Congestive heart failure (CHF) (HCC)    Diverticulitis    Diverticulitis    Gallstone    High blood pressure    Hypertension, essential    Neoplasm of right kidney    Obesity    Thrombocytopenia (HCC)    Vitamin D deficiency    Past Surgical History:  Procedure Laterality Date   CATARACT EXTRACTION Bilateral    LAPAROSCOPIC NEPHRECTOMY Right 08/22/2017   Procedure: LAPAROSCOPIC RADICAL NEPHRECTOMY;  Surgeon: Heloise Purpura, MD;  Location: [facility];  Service: Urology;  Laterality: Right;   Family History  Problem Relation Age of Onset   Ovarian cancer Mother    Brain cancer Sister    Cancer Brother    Colon cancer Brother    Esophageal cancer Neg Hx    Liver disease Neg Hx    Social History   Tobacco Use    Smoking status: Former   Smokeless tobacco: Never  Building services engineer Use: Never used  Substance Use Topics   Alcohol use: No   Drug use: No   Current Outpatient Medications  Medication Sig Dispense Refill   aspirin 81 MG chewable tablet Chew by mouth daily.     calcium carbonate (OS-CAL) 1250 (500 Ca) MG chewable tablet Chew by mouth.     cyanocobalamin (,VITAMIN B-12,) 1000 MCG/ML injection Inject 1,000 mcg into the muscle every 30 (thirty) days.     DOXYCYCLINE HYCLATE PO Take 100 mg by mouth.     Emollient (EUCERIN) lotion Apply topically as needed for dry skin.     ferrous sulfate 325 (65 FE) MG EC tablet Take 325 mg by mouth daily.     furosemide (LASIX) 40 MG tablet Take by mouth.     melatonin 3 MG TABS tablet Take by mouth.     Menthol, Topical Analgesic, (BIOFREEZE) 4 % GEL Apply topically 2 (two) times daily.     metoprolol tartrate (LOPRESSOR) 25 MG tablet Take 0.5 tablets (12.5 mg total) by mouth 2 (two) times daily. 60 tablet 0   polyethylene glycol (MIRALAX / GLYCOLAX) packet Take 17 g by mouth daily.     potassium chloride SA (KLOR-CON) 20 MEQ tablet Take 20 mEq by mouth 2 (two)  times daily.     Rivaroxaban (XARELTO) 15 MG TABS tablet Take 1 tablet (15 mg total) by mouth daily. 30 tablet 6   saccharomyces boulardii (FLORASTOR) 250 MG capsule Take 250 mg by mouth 2 (two) times daily.     simethicone (MYLICON) 125 MG chewable tablet Chew 125 mg by mouth every 6 (six) hours as needed for flatulence.     spironolactone (ALDACTONE) 25 MG tablet Take 25 mg by mouth daily.     tamsulosin (FLOMAX) 0.4 MG CAPS capsule Take 0.4 mg by mouth daily.     torsemide (DEMADEX) 100 MG tablet Take 100 mg by mouth daily.     atorvastatin (LIPITOR) 20 MG tablet Take 1 tablet (20 mg total) by mouth daily. 90 tablet 3   No current facility-administered medications for this visit.   No Known Allergies   Review of Systems: All systems reviewed and negative except where noted in HPI.    Physical Exam: BP (!) 150/68   Pulse 60   Ht 6\' 2"  (1.88 m)   Wt (!) 343 lb (155.6 kg)   BMI 44.04 kg/m  Constitutional: Pleasant,well-developed, male in no acute distress.  Sitting in wheelchair HEENT: Normocephalic and atraumatic. Conjunctivae are normal. No scleral icterus. Cardiovascular: Normal rate, regular rhythm.  Pulmonary/chest: Effort normal and breath sounds normal. No wheezing, rales or rhonchi. Abdominal: Soft, nondistended, nontender. Bowel sounds active throughout. There are no masses palpable. No hepatomegaly. Extremities: No edema Neurological: Alert and oriented to person place and time. Skin: Skin is warm and dry. No rashes noted. Psychiatric: Normal mood and affect. Behavior is normal.  Labs 03/2019: CBC nml. BMP with elevated Cr of 1.78.  Labs 03/2020: CBC normal.  Creatinine elevated at 1.77.  Labs 02/2023: Cr elevated at 1.6  CT C/A/P w/contrast 10/16/18: IMPRESSION: 1. Stable exam. No new or progressive interval findings. Specifically, no findings to suggest local recurrence or metastatic disease in this patient with a history of right renal cell carcinoma. 2. Stable appearance of the 10 mm hypodense lesion lower pole left kidney with no change in the exophytic interpolar left renal lesion, likely a cyst. 3. 4.1 cm infrarenal abdominal aortic aneurysm. Recommend followup by [place] in 1 year. This recommendation follows ACR consensus guidelines: [name] on Vascular Findings. [name] 2013; 10:789-794. 4. Cholelithiasis. 5. Stable appearance exophytic large right thyroid nodule  CTA A/P w/contrast 02/22/23: IMPRESSION: 1. Bilobed aneurysm of the infrarenal abdominal aorta with the proximal lobulation measuring 4.3 x 3.8 cm and the distal lobulation measuring 4.5 x 3.9 cm. These have mildly increased in size compared to prior examination from 06/21/2018. 2. Focal dilatation of the right common iliac  artery with displaced calcified plaque suspicious for non flow-limiting dissection. Maximum diameter of the right common iliac artery at this site measures 1.7 cm. 3. 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 4. New 1.6 x 1.2 x 1.5 cm solid lesion in the pancreatic head is suspicious for primary pancreatic neoplasm. Further evaluation with contrast-enhanced MRI of the abdomen is recommended. 5. 1.2 x 1.0 cm hyperenhancing nodule in the left adrenal body is new since prior examinations and requires further evaluation with adrenal mass protocol CT or MRI as it could represent metastatic disease. 6. Status post right nephrectomy. No evidence of local recurrence. 7. Extensive sigmoid colon diverticulosis. 8. Cholelithiasis. RECOMMENDATIONS: Recommend follow-up every 6 months and vascular consultation. This recommendation  follows ACR consensus guidelines: [name] on Vascular Findings. [name] 2013; 10:789-794.  ASSESSMENT AND PLAN: Pancreas mass Adrenal lesion Patient was incidentally noted on a CT scan in 02/2023 to have a solid lesion in the pancreas head that was suspicious for a pancreas malignancy.  He was also noted to have a nodule in the left adrenal gland.  I recommended that we proceed with further evaluation by performing labs, MRI imaging, and a possible endoscopic ultrasound for further examination of the pancreas mass.  However, patient states that he is currently feeling well and is not interested in any further workup for the pancreas mass or adrenal lesion.  He does understand that this means he may have an undiagnosed malignancy that could lead to further complications or even death, but he states that due to his age and comorbidities he would prefer to not do any further workup of the pancreas and adrenal lesions.  If he changes his mind in the future, he will let me know.   Although he has a prior diagnosis of mild vascular dementia per review of his care everywhere notes, he is alert and oriented x 3 during our discussion today, and he answers all questions appropriately. - Declined labs (CBC, CMP, lipase, CA19-9, CEA) today.  - Declined MRI abdomen w/contrast - Declined possible EUS - RTC PRN.  Patient declined to schedule a follow-up appointment at this time  Eulah Pont, MD  I spent 61 minutes of time, including in depth chart review, independent review of results as outlined above, communicating results with the patient directly, face-to-face time with the patient, coordinating care, ordering studies and medications as appropriate, and documentation.

## 2023-05-20 NOTE — Patient Instructions (Signed)
It was recommended you get labs and imaging today you declined if you change your mind please let you know   Follow up as needed   _______________________________________________________  If your blood pressure at your visit was 140/90 or greater, please contact your primary care physician to follow up on this.  _______________________________________________________  If you are age 83 or older, your body mass index should be between 23-30. Your Body mass index is 44.04 kg/m. If this is out of the aforementioned range listed, please consider follow up with your Primary Care Provider.  If you are age 35 or younger, your body mass index should be between 19-25. Your Body mass index is 44.04 kg/m. If this is out of the aformentioned range listed, please consider follow up with your Primary Care Provider.   ________________________________________________________  The [place] [facility] providers would like to encourage you to use [facility] to communicate with providers for non-urgent requests or questions.  Due to long hold times on the telephone, sending your provider a message by [facility] may be a faster and more efficient way to get a response.  Please allow 48 business hours for a response.  Please remember that this is for non-urgent requests.  _______________________________________________________   Thank you for entrusting me with your care and for choosing [facility], Dr. Eulah Pont

## 2023-08-21 NOTE — Progress Notes (Unsigned)
Cardiology Office Note:   Date:  08/21/2023  ID:  Vincent Pope, DOB 1941-01-12, MRN [id number] PCP: Bernerd Limbo, MD  [facility] Providers Cardiologist:  Prentice Docker, MD (Inactive) {  History of Present Illness:   Vincent Pope is a 82 y.o. male who presents for evaluation of permanent atrial fibrillation, HTN, AAA, chronic diastolic CHF, stage III CKD and presumed CAD (based off NST in 2018) who presents for follow up.     The patient lives at [facility].  ***   ***  He gets around with a walker and a wheelchair.  He is has had diastolic dysfunction and renal insufficiency.  I was trying to explore why he was no longer on 100 mg of torsemide and spironolactone but instead on a low-dose of furosemide.  I have the medication list now from [facility] confirming he is on 40 mg of Lasix.  In exploring this further with the patient he apparently was taken off of this probably because of his renal insufficiency and also because he did not want to be urinating as frequently.  He is adamant that he feels okay.  He is does have some dyspnea with exertion but he says it is because he is 79 and he is 300 pounds.  His chronic lower extremity swelling he says is not causing him a problem.  He gets his legs wrapped routinely at [facility].  He denies any new shortness of breath, PND or orthopnea.  He denies any chest pressure, neck or arm discomfort.  He does not use oxygen.    ROS: ***  Studies Reviewed:    EKG:       ***  Risk Assessment/Calculations:   {Does this patient have ATRIAL FIBRILLATION?[phone]} No BP recorded.  {Refresh Note OR Click here to enter BP  :1}***        Physical Exam:   VS:  There were no vitals taken for this visit.   Wt Readings from Last 3 Encounters:  05/20/23 (!) 343 lb (155.6 kg)  02/28/23 (!) 342 lb (155.1 kg)  02/25/23 (!) 334 lb 3.2 oz (151.6 kg)     GEN: Well nourished, well developed in no acute distress NECK: No JVD;  No carotid bruits CARDIAC: ***RRR, no murmurs, rubs, gallops RESPIRATORY:  Clear to auscultation without rales, wheezing or rhonchi  ABDOMEN: Soft, non-tender, non-distended EXTREMITIES:  No edema; No deformity   ASSESSMENT AND PLAN:   Permanent Atrial Fibrillation:  ***  He tolerates his chronic rhythm.  No change in therapy.    Chronic Diastolic CHF: ***   He does not want to take higher dose diuretics.  He thinks he is doing fine.  He does not like urinating as much.  He watches his salt.  He is breathing fine.  He would let us know if he has any shortness of breath.  At this point no change in therapy.    CAD:   ***  He would not be a candidate for any further invasive therapies.  He is not having any symptoms.    HTN:   The blood pressure is ***  controlled.  No change in therapy.    AAA:  ***  He would not be an interventional candidate with his morbid obesity.     {Are you ordering a CV Procedure (e.g. stress test, cath, DCCV, TEE, etc)?   Press F2        :[id number]}  Follow up ***  Signed, Rollene Rotunda, MD

## 2023-08-22 ENCOUNTER — Encounter: Payer: Self-pay | Admitting: Surgery

## 2023-08-22 ENCOUNTER — Ambulatory Visit (INDEPENDENT_AMBULATORY_CARE_PROVIDER_SITE_OTHER): Payer: Medicare Other | Admitting: Surgery

## 2023-08-22 ENCOUNTER — Ambulatory Visit (HOSPITAL_COMMUNITY)
Admission: RE | Admit: 2023-08-22 | Discharge: 2023-08-22 | Disposition: A | Discharge status: Home / Self Care | Payer: Medicare Other | Source: Ambulatory Visit | Attending: Surgery | Admitting: Surgery

## 2023-08-22 VITALS — BP 160/87 | HR 72 | Temp 97.7°F | Resp 20 | Ht 74.0 in | Wt 343.0 lb

## 2023-08-22 DIAGNOSIS — I7143 Infrarenal abdominal aortic aneurysm, without rupture: Secondary | ICD-10-CM | POA: Insufficient documentation

## 2023-08-22 NOTE — Progress Notes (Signed)
Vascular and Vein Specialist of [facility]  Patient name: Vincent Pope MRN: [id number] DOB: May 17, 1941 Sex: male   REASON FOR VISIT:    Follow up  HISOTRY OF PRESENT ILLNESS:    Vincent Pope is a 82 y.o. male who returns for follow-up of his abdominal aortic aneurysm.  In 2001, his aortic diameter was 4.1 cm.  CT scan on 3/24 showed a maximum diameter of 4.1 cm.  He denies any abdominal pain or back pain.   Patient has a history atrial fibrillation on Xarelto. He is on a statin for hypercholesterolemia. He suffers from congestive heart failure. He has bilateral lower extremity edema. He has a history of nephrectomy in 2018. His most recent creatinine was 1.46. He is stage IIIa renal insufficiency    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 from [facility]   CKD (chronic kidney disease)    Cognitive communication deficit    Congestive heart failure (CHF) (HCC)    Diverticulitis    Diverticulitis    Gallstone    High blood pressure    Hypertension, essential    Neoplasm of right kidney    Obesity    Thrombocytopenia (HCC)    Vitamin D deficiency      FAMILY HISTORY:   Family History  Problem Relation Age of Onset   Ovarian cancer Mother    Brain cancer Sister    Cancer Brother    Colon cancer Brother    Esophageal cancer Neg Hx    Liver disease Neg Hx     SOCIAL HISTORY:   Social History   Tobacco Use   Smoking status: Former   Smokeless tobacco: Never  Substance Use Topics   Alcohol use: No     ALLERGIES:   No Known Allergies   CURRENT MEDICATIONS:   Current Outpatient Medications  Medication Sig Dispense Refill   aspirin  81 MG chewable tablet Chew by mouth daily.     calcium carbonate (OS-CAL) 1250 (500 Ca) MG chewable tablet Chew by mouth.     cyanocobalamin (,VITAMIN B-12,) 1000 MCG/ML injection Inject 1,000 mcg into the muscle every 30 (thirty) days.     DOXYCYCLINE HYCLATE PO Take 100 mg by mouth.     Emollient (EUCERIN) lotion Apply topically as needed for dry skin.     ferrous sulfate 325 (65 FE) MG EC tablet Take 325 mg by mouth daily.     furosemide (LASIX) 40 MG tablet Take by mouth.     melatonin 3 MG TABS tablet Take by mouth.     Menthol, Topical Analgesic, (BIOFREEZE) 4 % GEL Apply topically 2 (two) times daily.     metoprolol tartrate (LOPRESSOR) 25 MG tablet Take 0.5 tablets (12.5 mg total) by mouth 2 (two) times daily. 60 tablet 0   polyethylene glycol (MIRALAX / GLYCOLAX) packet Take 17 g by mouth daily.     potassium chloride SA (KLOR-CON) 20 MEQ tablet Take 20 mEq by mouth 2 (two) times daily.     Rivaroxaban (XARELTO) 15 MG TABS tablet Take 1 tablet (15 mg total) by mouth daily. 30 tablet 6   saccharomyces boulardii (FLORASTOR) 250 MG capsule Take  250 mg by mouth 2 (two) times daily.     simethicone (MYLICON) 125 MG chewable tablet Chew 125 mg by mouth every 6 (six) hours as needed for flatulence.     spironolactone (ALDACTONE) 25 MG tablet Take 25 mg by mouth daily.     tamsulosin (FLOMAX) 0.4 MG CAPS capsule Take 0.4 mg by mouth daily.     torsemide (DEMADEX) 100 MG tablet Take 100 mg by mouth daily.     atorvastatin (LIPITOR) 20 MG tablet Take 1 tablet (20 mg total) by mouth daily. 90 tablet 3   No current facility-administered medications for this visit.    REVIEW OF SYSTEMS:   [X]  denotes positive finding, [ ]  denotes negative finding Cardiac  Comments:  Chest pain or chest pressure:    Shortness of breath upon exertion:    Short of breath when lying flat:    Irregular heart rhythm:        Vascular    Pain in calf, thigh, or hip brought on by ambulation:    Pain in feet at  night that wakes you up from your sleep:     Blood clot in your veins:    Leg swelling:         Pulmonary    Oxygen at home:    Productive cough:     Wheezing:         Neurologic    Sudden weakness in arms or legs:     Sudden numbness in arms or legs:     Sudden onset of difficulty speaking or slurred speech:    Temporary loss of vision in one eye:     Problems with dizziness:         Gastrointestinal    Blood in stool:     Vomited blood:         Genitourinary    Burning when urinating:     Blood in urine:        Psychiatric    Major depression:         Hematologic    Bleeding problems:    Problems with blood clotting too easily:        Skin    Rashes or ulcers:        Constitutional    Fever or chills:      PHYSICAL EXAM:   Vitals:   08/22/23 0842  BP: (!) 160/87  Pulse: 72  Resp: 20  Temp: 97.7 F (36.5 C)  SpO2: 95%  Weight: (!) 343 lb (155.6 kg)  Height: 6\' 2"  (1.88 m)    GENERAL: The patient is a well-nourished male, in no acute distress. The vital signs are documented above. CARDIAC: There is a regular rate and rhythm.  PULMONARY: Non-labored respirations ABDOMEN: Soft and non-tender MUSCULOSKELETAL: There are no major deformities or cyanosis. NEUROLOGIC: No focal weakness or paresthesias are detected. SKIN: There are no ulcers or rashes noted. PSYCHIATRIC: The patient has a normal affect.  STUDIES:   I have reviewed the following: Abdominal Aorta: There is evidence of abnormal dilatation of the mid and  distal Abdominal aorta. The largest aortic measurement is 5.5 cm.  Visualization of the Bilobed infrarenal AAA is technically difficult due  to limiting factors. The largest aortic  diameter has increased compared to prior exam. Previous diameter  measurement was 4.5 cm obtained on 02/22/23.  MEDICAL ISSUES:   AAA: Maximum diameter by ultrasound is 5.5 cm.  I suspect that this is overestimated, however I cannot  confirm that and so I am  going to send him for a CT angiogram to better define the size of his aneurysm.  I will get this done within the next month or so.    Charlena Cross, MD, FACS Vascular and Vein Specialists of [facility] [phone] Pager [phone]

## 2023-08-24 ENCOUNTER — Ambulatory Visit (INDEPENDENT_AMBULATORY_CARE_PROVIDER_SITE_OTHER): Payer: Medicare Other | Admitting: Cardiology

## 2023-08-24 ENCOUNTER — Encounter: Payer: Self-pay | Admitting: Cardiology

## 2023-08-24 VITALS — BP 120/80 | HR 66 | Ht 74.0 in | Wt 346.0 lb

## 2023-08-24 DIAGNOSIS — I2510 Atherosclerotic heart disease of native coronary artery without angina pectoris: Secondary | ICD-10-CM

## 2023-08-24 DIAGNOSIS — I10 Essential (primary) hypertension: Secondary | ICD-10-CM

## 2023-08-24 DIAGNOSIS — I4821 Permanent atrial fibrillation: Secondary | ICD-10-CM | POA: Diagnosis not present

## 2023-08-24 NOTE — Patient Instructions (Signed)

## 2023-09-30 ENCOUNTER — Other Ambulatory Visit: Payer: Self-pay

## 2023-09-30 DIAGNOSIS — I7133 Infrarenal abdominal aortic aneurysm, ruptured: Secondary | ICD-10-CM

## 2023-09-30 DIAGNOSIS — I7143 Infrarenal abdominal aortic aneurysm, without rupture: Secondary | ICD-10-CM

## 2023-10-21 ENCOUNTER — Encounter (HOSPITAL_COMMUNITY): Payer: Self-pay

## 2023-10-21 ENCOUNTER — Ambulatory Visit (HOSPITAL_COMMUNITY)
Admission: RE | Admit: 2023-10-21 | Discharge: 2023-10-21 | Disposition: A | Discharge status: Home / Self Care | Payer: Medicare Other | Source: Ambulatory Visit | Attending: Surgery | Admitting: Surgery

## 2023-10-21 DIAGNOSIS — N2889 Other specified disorders of kidney and ureter: Secondary | ICD-10-CM

## 2023-10-21 DIAGNOSIS — I7133 Infrarenal abdominal aortic aneurysm, ruptured: Secondary | ICD-10-CM | POA: Insufficient documentation

## 2023-10-21 DIAGNOSIS — I7143 Infrarenal abdominal aortic aneurysm, without rupture: Secondary | ICD-10-CM | POA: Insufficient documentation

## 2023-10-21 DIAGNOSIS — I714 Abdominal aortic aneurysm, without rupture, unspecified: Secondary | ICD-10-CM | POA: Insufficient documentation

## 2023-10-21 MED ORDER — IOHEXOL 350 MG/ML SOLN
80.0000 mL | Freq: Once | INTRAVENOUS | Status: AC | PRN
  Administered 2023-10-21: 80 mL via INTRAVENOUS

## 2023-10-26 LAB — POCT I-STAT CREATININE: Creatinine, Ser: 1.7 mg/dL — ABNORMAL HIGH (ref 0.61–1.24)

## 2023-10-31 ENCOUNTER — Encounter: Payer: Self-pay | Admitting: Surgery

## 2023-10-31 ENCOUNTER — Ambulatory Visit (INDEPENDENT_AMBULATORY_CARE_PROVIDER_SITE_OTHER): Payer: Medicare Other | Admitting: Surgery

## 2023-10-31 VITALS — BP 167/81 | HR 54 | Temp 97.9°F | Resp 20 | Ht 74.0 in | Wt 346.0 lb

## 2023-10-31 DIAGNOSIS — I7143 Infrarenal abdominal aortic aneurysm, without rupture: Secondary | ICD-10-CM

## 2023-10-31 NOTE — Progress Notes (Signed)
Vascular and Vein Specialist of [facility]  Patient name: Vincent Pope MRN: [id number] DOB: [birth year] Sex: male   REASON FOR VISIT:    Follow up  HISOTRY OF PRESENT ILLNESS:   ohn Polito is a 82 y.o. male who returns for follow-up of his abdominal aortic aneurysm.  In 2001, his aortic diameter was 4.1 cm.  CT scan on 3/24 showed a maximum diameter of 4.1 cm.  At his last visit he had a ultrasound showing greater than 5.5 cm maximum diameter.  I sent him for CT scan to better define this.  He denies any abdominal pain or back pain.   Patient has a history atrial fibrillation on Xarelto. He is on a statin for hypercholesterolemia. He suffers from congestive heart failure. He has bilateral lower extremity edema. He has a history of nephrectomy in 2018. His most recent creatinine was 1.46. He is stage IIIa renal insufficiency    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 from [facility]   CKD (chronic kidney disease)    Cognitive communication deficit    Congestive heart failure (CHF) (HCC)    Diverticulitis    Diverticulitis    Gallstone    High blood pressure    Hypertension, essential    Neoplasm of right kidney    Obesity    Thrombocytopenia (HCC)    Vitamin D deficiency      FAMILY HISTORY:   Family History  Problem Relation Age of Onset   Ovarian cancer Mother    Brain cancer Sister    Cancer Brother    Colon cancer Brother    Esophageal cancer Neg Hx    Liver disease Neg Hx     SOCIAL HISTORY:   Social History   Tobacco Use   Smoking status: Former   Smokeless tobacco: Never  Substance Use Topics   Alcohol use: No     ALLERGIES:    No Known Allergies   CURRENT MEDICATIONS:   Current Outpatient Medications  Medication Sig Dispense Refill   aspirin 81 MG chewable tablet Chew by mouth daily.     atorvastatin (LIPITOR) 10 MG tablet Take 10 mg by mouth daily.     calcium carbonate (OS-CAL) 1250 (500 Ca) MG chewable tablet Chew by mouth.     cyanocobalamin (,VITAMIN B-12,) 1000 MCG/ML injection Inject 1,000 mcg into the muscle every 30 (thirty) days.     DOXYCYCLINE HYCLATE PO Take 100 mg by mouth. (Patient not taking: Reported on 08/24/2023)     Emollient (EUCERIN) lotion Apply topically as needed for dry skin. (Patient not taking: Reported on 08/24/2023)     ferrous sulfate 325 (65 FE) MG EC tablet Take 325 mg by mouth daily.     furosemide (LASIX) 40 MG tablet Take by mouth.     guaiFENesin (ROBITUSSIN) 100 MG/5ML liquid Take 5 mLs by mouth every 4 (four) hours as needed for cough or to loosen phlegm.     melatonin 3 MG TABS tablet Take 3 mg by mouth at bedtime. Take 2 tablets by mouth at bedtime     Menthol, Topical Analgesic, (BIOFREEZE) 4 % GEL Apply topically 2 (two) times daily. (  Patient not taking: Reported on 08/24/2023)     polyethylene glycol (MIRALAX / GLYCOLAX) packet Take 17 g by mouth daily.     potassium chloride SA (KLOR-CON) 20 MEQ tablet Take 20 mEq by mouth 2 (two) times daily.     Rivaroxaban (XARELTO) 15 MG TABS tablet Take 1 tablet (15 mg total) by mouth daily. 30 tablet 6   saccharomyces boulardii (FLORASTOR) 250 MG capsule Take 250 mg by mouth 2 (two) times daily.     simethicone (MYLICON) 125 MG chewable tablet Chew 125 mg by mouth every 6 (six) hours as needed for flatulence.     tamsulosin (FLOMAX) 0.4 MG CAPS capsule Take 0.4 mg by mouth daily.     telmisartan (MICARDIS) 20 MG tablet 20 mg. Take 1/2 tablet by mouth daily     Vitamin D, Cholecalciferol, 25 MCG (1000 UT) CAPS Take by mouth.     No current facility-administered medications for this visit.    REVIEW OF SYSTEMS:   [X]  denotes  positive finding, [ ]  denotes negative finding Cardiac  Comments:  Chest pain or chest pressure:    Shortness of breath upon exertion:    Short of breath when lying flat:    Irregular heart rhythm:        Vascular    Pain in calf, thigh, or hip brought on by ambulation:    Pain in feet at night that wakes you up from your sleep:     Blood clot in your veins:    Leg swelling:  x       Pulmonary    Oxygen at home:    Productive cough:     Wheezing:         Neurologic    Sudden weakness in arms or legs:     Sudden numbness in arms or legs:     Sudden onset of difficulty speaking or slurred speech:    Temporary loss of vision in one eye:     Problems with dizziness:         Gastrointestinal    Blood in stool:     Vomited blood:         Genitourinary    Burning when urinating:     Blood in urine:        Psychiatric    Major depression:         Hematologic    Bleeding problems:    Problems with blood clotting too easily:        Skin    Rashes or ulcers:        Constitutional    Fever or chills:      PHYSICAL EXAM:   There were no vitals filed for this visit.  GENERAL: The patient is a well-nourished male, in no acute distress. The vital signs are documented above. CARDIAC: There is a regular rate and rhythm.  PULMONARY: Non-labored respirations ABDOMEN: Soft and non-tender MUSCULOSKELETAL: There are no major deformities or cyanosis. NEUROLOGIC: No focal weakness or paresthesias are detected. SKIN: There are no ulcers or rashes noted. PSYCHIATRIC: The patient has a normal affect.  STUDIES:   I have reviewed the CT scan with the following findings:  1. Stable size and configuration of the infrarenal abdominal aortic aneurysm. This is a bilobed abdominal aortic aneurysm that has a maximal AP dimension of 5.0 cm. 2. Stable focal dilatation in the proximal right common iliac artery measuring 1.6 cm. 3. Diffuse atherosclerotic calcifications throughout the  aorta, iliac arteries  and visceral arteries.   NON-VASCULAR   1. Persistent hyperdense or enhancing lesion in the pancreatic neck region with a small adjacent lesion. This lesion measures 1.7 cm in size and minimally changed since 02/22/2023. In addition, the patient has a hyperdense or enhancing left adrenal lesion that was not present in 2019. Left adrenal lesion has slightly enlarged since 02/22/2023. Findings could represent metastatic disease and/or neuroendocrine tumor. 2. Solitary left kidney. 3. Numerous hepatic cysts. 4. Cholelithiasis. MEDICAL ISSUES:   AAA: Maximum diameter is 5.0 cm by CT scan.  He is a endovascular candidate.  I will bring him back in 6 months for ultrasound surveillance with plans to proceed with repair if the aneurysm has increased in size  Adrenal and pancreatic lesions: I discussed this with the patient.  He says he is aware of these.  He does not want anything done with them.  He says "they have not bothered me and so I am not going to bother them "he does not want to be referred for further evaluation.    Charlena Cross, MD, FACS Vascular and Vein Specialists of [facility] [phone] Pager [phone]

## 2023-11-07 ENCOUNTER — Other Ambulatory Visit: Payer: Self-pay

## 2023-11-07 DIAGNOSIS — I7143 Infrarenal abdominal aortic aneurysm, without rupture: Secondary | ICD-10-CM

## 2024-04-30 ENCOUNTER — Encounter: Payer: Self-pay | Admitting: Surgery

## 2024-04-30 ENCOUNTER — Ambulatory Visit (HOSPITAL_COMMUNITY)
Admission: RE | Admit: 2024-04-30 | Discharge: 2024-04-30 | Disposition: A | Discharge status: Home / Self Care | Payer: Medicare Other | Source: Ambulatory Visit | Attending: Surgery | Admitting: Surgery

## 2024-04-30 ENCOUNTER — Ambulatory Visit: Payer: Medicare Other | Attending: Surgery | Admitting: Surgery

## 2024-04-30 VITALS — BP 163/73 | HR 66 | Temp 98.7°F | Resp 22 | Ht 74.0 in | Wt 346.0 lb

## 2024-04-30 DIAGNOSIS — I7143 Infrarenal abdominal aortic aneurysm, without rupture: Secondary | ICD-10-CM | POA: Insufficient documentation

## 2024-04-30 NOTE — Progress Notes (Signed)
 Vascular and Vein Specialist of [facility]  Patient name: Vincent Pope MRN: [id number] DOB: 1941/09/15 Sex: male   REASON FOR VISIT:    Follow-up  HISOTRY OF PRESENT ILLNESS:    Vincent Pope is a 83 y.o. male who returns for follow-up of his abdominal aortic aneurysm.  In 2001, his aortic diameter was 4.1 cm.  CT scan on 3/24 showed a maximum diameter of 4.1 cm.  CT scan and November 2024 showed a 5 cm aneurysm.  He is back today for follow-up.  He has no abdominal pain.  He does walk with a walker.   Patient has a history atrial fibrillation on Xarelto . He is on a statin for hypercholesterolemia. He suffers from congestive heart failure. He has bilateral lower extremity edema. He has a history of nephrectomy in 2018. His most recent creatinine was 1.46. He is stage IIIa renal insufficiency      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 from [facility]   CKD (chronic kidney disease)    Cognitive communication deficit    Congestive heart failure (CHF) (HCC)    Diverticulitis    Diverticulitis    Gallstone    High blood pressure    Hypertension, essential    Neoplasm of right kidney    Obesity    Thrombocytopenia (HCC)    Vitamin D deficiency      FAMILY HISTORY:   Family History  Problem Relation Age of Onset   Ovarian cancer Mother    Brain cancer Sister    Cancer Brother    Colon cancer Brother    Esophageal cancer Neg Hx    Liver disease Neg Hx     SOCIAL HISTORY:   Social History   Tobacco Use   Smoking status: Former   Smokeless tobacco: Never  Substance Use Topics   Alcohol use: No     ALLERGIES:   No Known  Allergies   CURRENT MEDICATIONS:   Current Outpatient Medications  Medication Sig Dispense Refill   aspirin  81 MG chewable tablet Chew by mouth daily.     atorvastatin  (LIPITOR) 10 MG tablet Take 10 mg by mouth daily.     calcium  carbonate (OS-CAL) 1250 (500 Ca) MG chewable tablet Chew by mouth.     ferrous sulfate 325 (65 FE) MG EC tablet Take 325 mg by mouth daily.     furosemide  (LASIX ) 40 MG tablet Take by mouth.     guaiFENesin (ROBITUSSIN) 100 MG/5ML liquid Take 5 mLs by mouth every 4 (four) hours as needed for cough or to loosen phlegm.     melatonin 3 MG TABS tablet Take 3 mg by mouth at bedtime. Take 2 tablets by mouth at bedtime     Menthol, Topical Analgesic, (BIOFREEZE) 4 % GEL Apply topically 2 (two) times daily.     polyethylene glycol (MIRALAX / GLYCOLAX) packet Take 17 g by mouth daily.     potassium chloride SA (KLOR-CON) 20 MEQ tablet Take 20 mEq by mouth 2 (two) times daily.     Rivaroxaban  (XARELTO ) 15 MG TABS tablet Take 1 tablet (15 mg total) by mouth daily. 30 tablet 6   tamsulosin  (  FLOMAX ) 0.4 MG CAPS capsule Take 0.4 mg by mouth daily.     telmisartan (MICARDIS) 20 MG tablet 20 mg. Take 1/2 tablet by mouth daily     cyanocobalamin (,VITAMIN B-12,) 1000 MCG/ML injection Inject 1,000 mcg into the muscle every 30 (thirty) days.     DOXYCYCLINE HYCLATE PO Take 100 mg by mouth. (Patient not taking: Reported on 04/30/2024)     Emollient (EUCERIN) lotion Apply topically as needed for dry skin.     saccharomyces boulardii (FLORASTOR) 250 MG capsule Take 250 mg by mouth 2 (two) times daily. (Patient not taking: Reported on 04/30/2024)     simethicone (MYLICON) 125 MG chewable tablet Chew 125 mg by mouth every 6 (six) hours as needed for flatulence. (Patient not taking: Reported on 04/30/2024)     No current facility-administered medications for this visit.    REVIEW OF SYSTEMS:   [X]  denotes positive finding, [ ]  denotes negative finding Cardiac  Comments:  Chest pain or  chest pressure:    Shortness of breath upon exertion:    Short of breath when lying flat:    Irregular heart rhythm:        Vascular    Pain in calf, thigh, or hip brought on by ambulation:    Pain in feet at night that wakes you up from your sleep:     Blood clot in your veins:    Leg swelling:         Pulmonary    Oxygen at home:    Productive cough:     Wheezing:         Neurologic    Sudden weakness in arms or legs:     Sudden numbness in arms or legs:     Sudden onset of difficulty speaking or slurred speech:    Temporary loss of vision in one eye:     Problems with dizziness:         Gastrointestinal    Blood in stool:     Vomited blood:         Genitourinary    Burning when urinating:     Blood in urine:        Psychiatric    Major depression:         Hematologic    Bleeding problems:    Problems with blood clotting too easily:        Skin    Rashes or ulcers:        Constitutional    Fever or chills:      PHYSICAL EXAM:   Vitals:   04/30/24 1021  BP: (!) 163/73  Pulse: 66  Resp: (!) 22  Temp: 98.7 F (37.1 C)  TempSrc: Temporal  SpO2: 97%  Weight: (!) 346 lb (156.9 kg)  Height: 6\' 2"  (1.88 m)    GENERAL: The patient is a well-nourished male, in no acute distress. The vital signs are documented above. CARDIAC: There is a regular rate and rhythm.  PULMONARY: Non-labored respirations ABDOMEN: Soft and non-tender MUSCULOSKELETAL: There are no major deformities or cyanosis. NEUROLOGIC: No focal weakness or paresthesias are detected. SKIN: There are no ulcers or rashes noted. PSYCHIATRIC: The patient has a normal affect.  STUDIES:   I have reviewed the following ultrasound: Abdominal Aorta: There is evidence of abnormal dilatation of the distal  Abdominal aorta. The largest aortic diameter remains essentially unchanged  compared to prior exam. Previous diameter measurement was 5.0 cm obtained  on 10/21/2023 CT angio.  MEDICAL ISSUES:    AAA: Ultrasound diameter is significantly less today than had been previous.  I therefore have elected to get a CT scan to confirm the diameter of his aneurysm and also make sure that he remains an endovascular candidate.  His ABIs were significantly low depressed on the left leg and so I am also going to get CT angio with runoff so that I had a baseline prior to endovascular aneurysm repair.  He will get this done in the next 2 to 3 weeks and return for follow-up.  He will then most likely schedule his aneurysm repair.  He would like to wait until after the summer and we can discuss that.  He will also need cardiac clearance from Dr. Carl Charnley, MD, [facility] Vascular and Vein Specialists of [facility] [phone] Pager [phone]

## 2024-05-02 ENCOUNTER — Other Ambulatory Visit: Payer: Self-pay

## 2024-05-02 DIAGNOSIS — I7143 Infrarenal abdominal aortic aneurysm, without rupture: Secondary | ICD-10-CM

## 2024-05-04 ENCOUNTER — Encounter (HOSPITAL_COMMUNITY): Payer: Self-pay | Admitting: Adult Health

## 2024-05-21 ENCOUNTER — Ambulatory Visit: Admitting: Surgery

## 2024-05-30 ENCOUNTER — Ambulatory Visit (HOSPITAL_BASED_OUTPATIENT_CLINIC_OR_DEPARTMENT_OTHER)
Admission: RE | Admit: 2024-05-30 | Discharge: 2024-05-30 | Disposition: A | Discharge status: Home / Self Care | Source: Ambulatory Visit | Attending: Surgery | Admitting: Surgery

## 2024-05-30 DIAGNOSIS — I7143 Infrarenal abdominal aortic aneurysm, without rupture: Secondary | ICD-10-CM | POA: Insufficient documentation

## 2024-05-30 LAB — POCT I-STAT CREATININE: Creatinine, Ser: 1.6 mg/dL — ABNORMAL HIGH (ref 0.61–1.24)

## 2024-05-30 MED ORDER — IOHEXOL 350 MG/ML SOLN
100.0000 mL | Freq: Once | INTRAVENOUS | Status: AC | PRN
  Administered 2024-05-30: 100 mL via INTRAVENOUS

## 2024-06-11 ENCOUNTER — Encounter: Payer: Self-pay | Admitting: Surgery

## 2024-06-11 ENCOUNTER — Ambulatory Visit: Attending: Surgery | Admitting: Surgery

## 2024-06-11 VITALS — BP 160/75 | HR 62 | Temp 97.9°F

## 2024-06-11 DIAGNOSIS — I7143 Infrarenal abdominal aortic aneurysm, without rupture: Secondary | ICD-10-CM | POA: Diagnosis not present

## 2024-06-11 NOTE — Progress Notes (Signed)
 [facility]ascular and [facility]in [facility]ist of [facility]  Patient name: Vincent Pope MRN: [id number] DOB: 17-Jun-1941 Sex: male   REASON FOR VISIT:    Follow up  HISOTRY OF PRESENT ILLNESS:    Vincent Pope is a 83 y.o. male who is back for follow-up of his aneurysm which was initially detected in 2001 where it measured 4 cm.  He had a CT scan a year ago that showed a 5 cm aneurysm.  This was unchanged on recent ultrasound and so I sent him for CT scan for better evaluation of his anatomy.  He has no complaints today  Patient has a history atrial fibrillation on Xarelto . He is on a statin for hypercholesterolemia. He suffers from congestive heart failure. He has bilateral lower extremity edema. He has a history of nephrectomy in 2018. His most recent creatinine was 1.46. He is stage IIIa renal insufficiency.  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  He has seen GI but is not interested in further workup.  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 from [facility]   CKD (chronic kidney disease)    Cognitive communication deficit    Congestive heart failure (CHF) (HCC)    Diverticulitis    Diverticulitis    Gallstone    High blood pressure    Hypertension, essential    Neoplasm of right kidney    Obesity    Thrombocytopenia (HCC)    Vitamin D deficiency      FAMILY HISTORY:   Family History  Problem Relation Age of Onset   Ovarian cancer Mother    Brain cancer Sister    Cancer Brother    Colon cancer Brother    Esophageal cancer Neg Hx    Liver disease Neg Hx     SOCIAL HISTORY:   Social History   Tobacco Use   Smoking status: Former    Smokeless tobacco: Never  Substance Use Topics   Alcohol use: No     ALLERGIES:   No Known Allergies   CURRENT MEDICATIONS:   Current Outpatient Medications  Medication Sig Dispense Refill   aspirin  81 MG chewable tablet Chew by mouth daily.     atorvastatin  (LIPITOR) 10 MG tablet Take 10 mg by mouth daily.     calcium  carbonate (OS-CAL) 1250 (500 Ca) MG chewable tablet Chew by mouth.     cyanocobalamin (,VITAMIN B-12,) 1000 MCG/ML injection Inject 1,000 mcg into the muscle every 30 (thirty) days.     DOXYCYCLINE HYCLATE PO Take 100 mg by mouth.     Emollient (EUCERIN) lotion Apply topically as needed for dry skin.     ferrous sulfate 325 (65 FE) MG EC tablet Take 325 mg by mouth daily.     furosemide  (LASIX ) 40 MG tablet Take by mouth.     guaiFENesin (ROBITUSSIN) 100 MG/5ML liquid Take 5 mLs by mouth every 4 (four) hours as needed for cough or to loosen phlegm.     melatonin 3 MG TABS tablet Take 3 mg by mouth at bedtime. Take 2 tablets by mouth at bedtime     Menthol, Topical Analgesic, (BIOFREEZE) 4 % GEL Apply topically  2 (two) times daily.     polyethylene glycol (MIRALAX / GLYCOLAX) packet Take 17 g by mouth daily.     potassium chloride SA (KLOR-CON) 20 MEQ tablet Take 20 mEq by mouth 2 (two) times daily.     Rivaroxaban  (XARELTO ) 15 MG TABS tablet Take 1 tablet (15 mg total) by mouth daily. 30 tablet 6   saccharomyces boulardii (FLORASTOR) 250 MG capsule Take 250 mg by mouth 2 (two) times daily.     simethicone (MYLICON) 125 MG chewable tablet Chew 125 mg by mouth every 6 (six) hours as needed for flatulence.     tamsulosin  (FLOMAX ) 0.4 MG CAPS capsule Take 0.4 mg by mouth daily.     telmisartan (MICARDIS) 20 MG tablet 20 mg. Take 1/2 tablet by mouth daily     No current facility-administered medications for this visit.    REVIEW OF SYSTEMS:   [X]  denotes positive finding, [ ]  denotes negative finding Cardiac  Comments:  Chest pain or chest pressure:    Shortness of  breath upon exertion:    Short of breath when lying flat:    Irregular heart rhythm:        Vascular    Pain in calf, thigh, or hip brought on by ambulation:    Pain in feet at night that wakes you up from your sleep:     Blood clot in your veins:    Leg swelling:         Pulmonary    Oxygen at home:    Productive cough:     Wheezing:         Neurologic    Sudden weakness in arms or legs:     Sudden numbness in arms or legs:     Sudden onset of difficulty speaking or slurred speech:    Temporary loss of vision in one eye:     Problems with dizziness:         Gastrointestinal    Blood in stool:     Vomited blood:         Genitourinary    Burning when urinating:     Blood in urine:        Psychiatric    Major depression:         Hematologic    Bleeding problems:    Problems with blood clotting too easily:        Skin    Rashes or ulcers:        Constitutional    Fever or chills:      PHYSICAL EXAM:   Vitals:   06/11/24 0905  BP: (!) 160/75  Pulse: 62  Temp: 97.9 F (36.6 C)  SpO2: 93%    GENERAL: The patient is a well-nourished male, in no acute distress. The vital signs are documented above. CARDIAC: There is a regular rate and rhythm. PULMONARY: Non-labored respirations ABDOMEN: Soft and non-tender  MUSCULOSKELETAL: There are no major deformities or cyanosis. NEUROLOGIC: No focal weakness or paresthesias are detected. SKIN: There are no ulcers or rashes noted. PSYCHIATRIC: The patient has a normal affect.  STUDIES:   I have reviewed the CT scan with the following findings: 1. 4.9 cm infrarenal abdominal aortic aneurysm, previously 5 cm. Recommend follow-up CT/MR every 6 months and vascular consultation. 2. Continued enlargement of 3.4 cm enhancing pancreatic neck mass, previously 2.5 cm. 3. 1.6 cm enhancing left adrenal nodule, previously 1.4 cm. 4. Cholelithiasis. 5. Sigmoid diverticulosis. 6. Coronary and aortic Atherosclerosis  (ICD10-I70.0).  MEDICAL ISSUES:   AAA: Maximum diameter by CT scan today is 4.9 cm.  He is pain-free.  In addition, he has an enlarging pancreatic neck mass which he does not want to address.  I do not think that intervention on his aneurysm is warranted at this time based off of its size and the fact that he may have a underlying pancreatic malignancy that he does not want to address.  I have recommended follow-up CT scan in 6 months.    Malvina Serene CLORE, MD, FACS [facility] [phone] Pager [phone]

## 2024-09-03 NOTE — Progress Notes (Unsigned)
 Cardiology Office Note:   Date:  09/05/2024  ID:  Vincent Pope, DOB [birth year], MRN [id number] PCP: Celine Erla Bong, MD  [facility] Providers Cardiologist:  Lynwood Schilling, MD {  History of Present Illness:   Vincent Pope is a 83 y.o. male who presents for evaluation of permanent atrial fibrillation, HTN, AAA, chronic diastolic CHF, stage III CKD and presumed CAD (based off NST in 2018) who presents for follow up.     The patient lives at [facility].  He reports that he does some physical therapy walks with a walker.  He gets himself out of the bed.  For any long distance he is in a wheelchair.  He has chronic lower extremity swelling.  He has renal insufficiency with only 1 kidney.  He is followed by nephrology.  He says that he was recently put on sodium but I do not actually do not see this on his med list and I do not have recent labs.  He has been followed by Dr. Serene for abdominal aortic aneurysm and has 77-month follow-up scheduled.  He also has a pancreatic mass that is expanding in the neck of his pancreas and he has not wanted to have anything done about this.  He wants conservative management.  The patient denies any new symptoms such as chest discomfort, neck or arm discomfort. There has been no new shortness of breath, PND or orthopnea. There have been no reported palpitations, presyncope or syncope.    ROS: As stated in the HPI and negative for all other systems.  Studies Reviewed:    EKG:   EKG Interpretation Date/Time:  Wednesday September 05 2024 09:15:35 EDT Ventricular Rate:  65 PR Interval:    QRS Duration:  104 QT Interval:  406 QTC Calculation: 422 R Axis:   260  Text Interpretation: Atrial fibrillation Right superior axis deviation Low voltage QRS Cannot rule out Anteroseptal infarct (cited on or before 24-Aug-2023) When compared with ECG of 24-Aug-2023 13:23, No significant change was found Confirmed by Schilling Lynwood ([id number]) on 09/05/2024  9:21:21 AM     Risk Assessment/Calculations:    CHA2DS2-VASc Score = 5   This indicates a 7.2% annual risk of stroke. The patient's score is based upon: CHF History: 1 HTN History: 1 Diabetes History: 0 Stroke History: 0 Vascular Disease History: 1 Age Score: 2 Gender Score: 0     Physical Exam:   VS:  BP 110/60   Pulse 65   Ht 6' 2 (1.88 m)   Wt (!) 341 lb (154.7 kg)   BMI 43.78 kg/m    Wt Readings from Last 3 Encounters:  09/05/24 (!) 341 lb (154.7 kg)  04/30/24 (!) 346 lb (156.9 kg)  10/31/23 (!) 346 lb (156.9 kg)     GEN: Well nourished, well developed in no acute distress NECK: No JVD; No carotid bruits CARDIAC: Irregular RR, no murmurs, rubs, gallops RESPIRATORY:  Clear to auscultation without rales, wheezing or rhonchi  ABDOMEN: Soft, non-tender, non-distended EXTREMITIES: Severe bilateral lower extremity edema; No deformity.  Her feet are wrapped.  ASSESSMENT AND PLAN:   Permanent Atrial Fibrillation:   He tolerates anticoagulation.  He has good rate control.  No change in therapy other than to discontinue aspirin  as below.   Chronic Diastolic CHF:   The patient seems to be euvolemic.  I am going to get his blood work and try to verify the medications as he says he is on sodium tablet.  We certainly need to  watch his volume carefully.   CAD:   I will stop his aspirin .  Otherwise he will continue with risk reduction.   HTN:   The blood pressure is at target.  No change in therapy.  AAA: He is having is followed by Dr. Serene.  I do not think he will ever be really an interventional candidate with his morbid obesity advanced age and comorbid issues but I will defer management.   Follow up with me in about 18 months or sooner if needed.  Signed, Lynwood Schilling, MD

## 2024-09-05 ENCOUNTER — Encounter: Payer: Self-pay | Admitting: Cardiology

## 2024-09-05 ENCOUNTER — Encounter: Payer: Self-pay | Admitting: *Deleted

## 2024-09-05 ENCOUNTER — Ambulatory Visit (INDEPENDENT_AMBULATORY_CARE_PROVIDER_SITE_OTHER): Admitting: Cardiology

## 2024-09-05 VITALS — BP 110/60 | HR 65 | Ht 74.0 in | Wt 341.0 lb

## 2024-09-05 DIAGNOSIS — I5032 Chronic diastolic (congestive) heart failure: Secondary | ICD-10-CM | POA: Diagnosis not present

## 2024-09-05 DIAGNOSIS — I2510 Atherosclerotic heart disease of native coronary artery without angina pectoris: Secondary | ICD-10-CM | POA: Diagnosis not present

## 2024-09-05 DIAGNOSIS — I4821 Permanent atrial fibrillation: Secondary | ICD-10-CM

## 2024-09-05 NOTE — Patient Instructions (Addendum)
 Medication Instructions:  Your physician has recommended you make the following change in your medication:  Stop aspirin  Continue all other medications as prescribed  Labwork: none  Testing/Procedures: none  Follow-Up: Your physician recommends that you schedule a follow-up appointment in: 18 months. You will receive a reminder call in about 12-14 months reminding you to schedule your appointment. If you don't receive this call, please contact our office.  Any Other Special Instructions Will Be Listed Below (If Applicable).  If you need a refill on your cardiac medications before your next appointment, please call your pharmacy.

## 2024-11-03 LAB — LAB REPORT - SCANNED: EGFR: 39

## 2024-11-05 ENCOUNTER — Telehealth: Payer: Self-pay | Admitting: Cardiology

## 2024-11-05 NOTE — Telephone Encounter (Signed)
 Nurse from patients facility calling concerned about his medications. She would like to discuss medication changes and is requesting a callback from the physician. Please advise.   Francisca with [facility]  [phone]

## 2024-11-05 NOTE — Telephone Encounter (Signed)
 Returned phone call from [place] (with [facility]). She is temporarily holding the Sodium Bicarbonate that the Nephrologist prescribed back in September. (Initially given for Metabolic Acidosis)   Pt has never had any problems with weeping of the legs. Currently, both legs are weeping fluid and the RIGHT leg is worse than the left. He has been on Lasix  for years, currently on 20 mg once daily. But the pt let Francisca know that he does NOT take Lasix  on the weekends, as he wants a break from it. Clovis stated that she advised pt to take this medication on a daily basis.   Recent labs (CBC, CMP, BNP)  (BUN=45, GFR=39, BNP=130, per Clovis) were drawn at the facility. Nursing staff there to fax us  these labs. Also, Clovis is waiting for Nephrology to return her phone call regarding the medication.

## 2024-11-07 NOTE — Telephone Encounter (Signed)
 Left voice message with Sammie to call back 11/26

## 2024-11-09 ENCOUNTER — Ambulatory Visit: Payer: Self-pay | Admitting: Cardiology

## 2024-11-15 NOTE — Telephone Encounter (Signed)
 Spoke with Fransica regarding the pt. Ferris stated that the pt's facility will have to set up the appointment. Office number was provided. Ferris will have them call to make an appointment with Dr. Lavona.

## 2024-11-19 NOTE — Telephone Encounter (Signed)
 Nursing facility called to scheduled appt offered soonest appt which was scheduled in Jan please advise

## 2024-11-27 NOTE — Telephone Encounter (Signed)
 Spoke with Ferris regarding an appointment. An earlier appointment was not available. Will send to scheduling team to put pt on a wait list to see either [facility] or an APP. Ferris verbalized understanding. All questions if any were answered.

## 2024-12-21 ENCOUNTER — Ambulatory Visit: Admitting: Emergency Medicine

## 2024-12-21 NOTE — Progress Notes (Unsigned)
" °  Cardiology Office Note:    Date:  12/21/2024  ID:  Vincent Pope, DOB February 08, 1941, MRN [id number] PCP: No primary care provider on file.  [facility] Providers Cardiologist:  Lynwood Schilling, MD { Click to update primary MD,subspecialty MD or APP then REFRESH:1}    {Click to Open Review  :1}   Patient Profile:       Chief Complaint: *** History of Present Illness:  Vincent Pope is a 84 y.o. male with visit-pertinent history of permanent atrial fibrillation, hypertension, AAA, diastolic heart failure, CKD stage III and presumed CAD (based off at NST in 2018)  He has presumed CAD based off his prior stress test in 2018 which showed findings consistent with a prior infarction with peri-infarct ischemia.  Last seen by Dr. Schilling on 09/05/2024.  He was tolerating anticoagulation well.  The patient seems to be euvolemic on exam.  Blood pressure was at goal.  No medication changes were made and patient was to follow-up in 1 year.  Discussed the use of AI scribe software for clinical note transcription with the patient, who gave verbal consent to proceed.  History of Present Illness     Review of systems:  Please see the history of present illness. All other systems are reviewed and otherwise negative. ***      Studies Reviewed:        ***  Risk Assessment/Calculations:   {Does this patient have ATRIAL FIBRILLATION?:[phone]} No BP recorded.  {Refresh Note OR Click here to enter BP  :1}***        Physical Exam:   VS:  There were no vitals taken for this visit.   Wt Readings from Last 3 Encounters:  09/05/24 (!) 341 lb (154.7 kg)  04/30/24 (!) 346 lb (156.9 kg)  10/31/23 (!) 346 lb (156.9 kg)    GEN: Well nourished, well developed in no acute distress NECK: No JVD; No carotid bruits CARDIAC: ***RRR, no murmurs, rubs, gallops RESPIRATORY:  Clear to auscultation without rales, wheezing or rhonchi  ABDOMEN: Soft, non-tender, non-distended EXTREMITIES:  No edema;  No acute deformity ***      Assessment and Plan:    Assessment and Plan Assessment & Plan      {Are you ordering a CV Procedure (e.g. stress test, cath, DCCV, TEE, etc)?   Press F2        :[id number]}  Dispo:  No follow-ups on file.  Signed, Lum LITTIE Louis, NP  "

## 2025-01-31 ENCOUNTER — Ambulatory Visit: Admitting: Emergency Medicine
